# Patient Record
Sex: Female | Born: 1960 | Race: Black or African American | Hispanic: No | Marital: Married | Smoking: Never smoker
Health system: Southern US, Community
[De-identification: ages and names within clinical notes are randomized; demographics above are authoritative.]

## PROBLEM LIST (undated history)

## (undated) DIAGNOSIS — Z789 Other specified health status: Secondary | ICD-10-CM

## (undated) DIAGNOSIS — B009 Herpesviral infection, unspecified: Secondary | ICD-10-CM

## (undated) DIAGNOSIS — Z87442 Personal history of urinary calculi: Secondary | ICD-10-CM

## (undated) DIAGNOSIS — R011 Cardiac murmur, unspecified: Secondary | ICD-10-CM

## (undated) DIAGNOSIS — G709 Myoneural disorder, unspecified: Secondary | ICD-10-CM

---

## 1998-05-02 ENCOUNTER — Emergency Department (HOSPITAL_COMMUNITY): Admission: EM | Admit: 1998-05-02 | Discharge: 1998-05-02 | Payer: Self-pay

## 1999-01-10 ENCOUNTER — Emergency Department (HOSPITAL_COMMUNITY): Admission: EM | Admit: 1999-01-10 | Discharge: 1999-01-11 | Payer: Self-pay | Admitting: Emergency Medicine

## 1999-01-10 ENCOUNTER — Encounter: Payer: Self-pay | Admitting: Emergency Medicine

## 1999-01-16 ENCOUNTER — Encounter: Payer: Self-pay | Admitting: Internal Medicine

## 1999-01-16 ENCOUNTER — Ambulatory Visit (HOSPITAL_COMMUNITY): Admission: RE | Admit: 1999-01-16 | Discharge: 1999-01-16 | Payer: Self-pay | Admitting: Internal Medicine

## 1999-06-29 ENCOUNTER — Ambulatory Visit (HOSPITAL_COMMUNITY): Admission: RE | Admit: 1999-06-29 | Discharge: 1999-06-29 | Payer: Self-pay | Admitting: Urology

## 1999-06-29 ENCOUNTER — Encounter: Payer: Self-pay | Admitting: Urology

## 1999-07-06 ENCOUNTER — Other Ambulatory Visit: Admission: RE | Admit: 1999-07-06 | Discharge: 1999-07-06 | Payer: Self-pay | Admitting: Obstetrics and Gynecology

## 2000-01-19 ENCOUNTER — Other Ambulatory Visit: Admission: RE | Admit: 2000-01-19 | Discharge: 2000-01-19 | Payer: Self-pay | Admitting: Obstetrics and Gynecology

## 2000-04-18 ENCOUNTER — Other Ambulatory Visit: Admission: RE | Admit: 2000-04-18 | Discharge: 2000-04-18 | Payer: Self-pay | Admitting: Obstetrics and Gynecology

## 2000-07-08 ENCOUNTER — Other Ambulatory Visit: Admission: RE | Admit: 2000-07-08 | Discharge: 2000-07-08 | Payer: Self-pay | Admitting: Obstetrics and Gynecology

## 2000-07-08 ENCOUNTER — Encounter (INDEPENDENT_AMBULATORY_CARE_PROVIDER_SITE_OTHER): Payer: Self-pay

## 2000-10-20 ENCOUNTER — Other Ambulatory Visit: Admission: RE | Admit: 2000-10-20 | Discharge: 2000-10-20 | Payer: Self-pay | Admitting: Obstetrics and Gynecology

## 2001-04-10 ENCOUNTER — Other Ambulatory Visit: Admission: RE | Admit: 2001-04-10 | Discharge: 2001-04-10 | Payer: Self-pay | Admitting: Obstetrics and Gynecology

## 2001-04-11 ENCOUNTER — Encounter (INDEPENDENT_AMBULATORY_CARE_PROVIDER_SITE_OTHER): Payer: Self-pay | Admitting: Specialist

## 2001-04-11 ENCOUNTER — Other Ambulatory Visit: Admission: RE | Admit: 2001-04-11 | Discharge: 2001-04-11 | Payer: Self-pay | Admitting: Unknown Physician Specialty

## 2001-04-19 ENCOUNTER — Encounter: Admission: RE | Admit: 2001-04-19 | Discharge: 2001-04-19 | Payer: Self-pay | Admitting: Obstetrics and Gynecology

## 2001-04-19 ENCOUNTER — Encounter: Payer: Self-pay | Admitting: Obstetrics and Gynecology

## 2002-07-16 ENCOUNTER — Other Ambulatory Visit: Admission: RE | Admit: 2002-07-16 | Discharge: 2002-07-16 | Payer: Self-pay | Admitting: Obstetrics and Gynecology

## 2002-07-25 ENCOUNTER — Encounter: Payer: Self-pay | Admitting: Obstetrics and Gynecology

## 2002-07-25 ENCOUNTER — Ambulatory Visit (HOSPITAL_COMMUNITY): Admission: RE | Admit: 2002-07-25 | Discharge: 2002-07-25 | Payer: Self-pay | Admitting: Obstetrics and Gynecology

## 2002-10-19 ENCOUNTER — Encounter: Payer: Self-pay | Admitting: Obstetrics and Gynecology

## 2002-10-19 ENCOUNTER — Encounter: Admission: RE | Admit: 2002-10-19 | Discharge: 2002-10-19 | Payer: Self-pay | Admitting: Obstetrics and Gynecology

## 2003-08-13 ENCOUNTER — Other Ambulatory Visit: Admission: RE | Admit: 2003-08-13 | Discharge: 2003-08-13 | Payer: Self-pay | Admitting: Obstetrics and Gynecology

## 2003-10-05 HISTORY — PX: UTERINE FIBROID SURGERY: SHX826

## 2004-07-28 ENCOUNTER — Encounter: Admission: RE | Admit: 2004-07-28 | Discharge: 2004-07-28 | Payer: Self-pay | Admitting: Internal Medicine

## 2004-08-26 ENCOUNTER — Other Ambulatory Visit: Admission: RE | Admit: 2004-08-26 | Discharge: 2004-08-26 | Payer: Self-pay | Admitting: Obstetrics and Gynecology

## 2005-03-11 ENCOUNTER — Ambulatory Visit (HOSPITAL_COMMUNITY): Admission: RE | Admit: 2005-03-11 | Discharge: 2005-03-11 | Payer: Self-pay | Admitting: Obstetrics and Gynecology

## 2005-10-04 HISTORY — PX: LIPOMA EXCISION: SHX5283

## 2005-10-11 ENCOUNTER — Encounter: Admission: RE | Admit: 2005-10-11 | Discharge: 2005-10-11 | Payer: Self-pay | Admitting: Obstetrics and Gynecology

## 2005-11-02 ENCOUNTER — Other Ambulatory Visit: Admission: RE | Admit: 2005-11-02 | Discharge: 2005-11-02 | Payer: Self-pay | Admitting: Obstetrics and Gynecology

## 2006-03-04 ENCOUNTER — Encounter (INDEPENDENT_AMBULATORY_CARE_PROVIDER_SITE_OTHER): Payer: Self-pay | Admitting: Specialist

## 2006-03-04 ENCOUNTER — Ambulatory Visit (HOSPITAL_BASED_OUTPATIENT_CLINIC_OR_DEPARTMENT_OTHER): Admission: RE | Admit: 2006-03-04 | Discharge: 2006-03-04 | Payer: Self-pay | Admitting: *Deleted

## 2008-01-02 ENCOUNTER — Encounter: Admission: RE | Admit: 2008-01-02 | Discharge: 2008-01-02 | Payer: Self-pay | Admitting: Internal Medicine

## 2008-01-16 ENCOUNTER — Other Ambulatory Visit: Admission: RE | Admit: 2008-01-16 | Discharge: 2008-01-16 | Payer: Self-pay | Admitting: Obstetrics and Gynecology

## 2009-02-04 ENCOUNTER — Encounter: Admission: RE | Admit: 2009-02-04 | Discharge: 2009-02-04 | Payer: Self-pay | Admitting: Obstetrics and Gynecology

## 2009-02-24 ENCOUNTER — Other Ambulatory Visit: Admission: RE | Admit: 2009-02-24 | Discharge: 2009-02-24 | Payer: Self-pay | Admitting: Obstetrics and Gynecology

## 2010-03-16 ENCOUNTER — Encounter: Admission: RE | Admit: 2010-03-16 | Discharge: 2010-03-16 | Payer: Self-pay | Admitting: Internal Medicine

## 2010-10-25 ENCOUNTER — Encounter: Payer: Self-pay | Admitting: Internal Medicine

## 2011-02-19 NOTE — Op Note (Signed)
Alicia [name], Alicia [name]                ACCOUNT NO.:  [id number]   MEDICAL RECORD NO.:  [id number]          PATIENT TYPE:  AMB   LOCATION:  [facility]                         FACILITY:  [facility]   PHYSICIAN:  Alfonse Ras, MD   DATE OF BIRTH:  1961/03/14   DATE OF PROCEDURE:  03/04/2006  DATE OF DISCHARGE:                                 OPERATIVE REPORT   PREOPERATIVE DIAGNOSIS:  Left shoulder lipoma.   POSTOPERATIVE DIAGNOSIS:  Left shoulder lipoma.   PROCEDURE:  Excision of left shoulder lipoma.   SURGEON:  Alfonse Ras, M.D.   ANESTHESIA:  Local MAC.   DESCRIPTION:  The patient was taken to the operating room and placed in a  supine position.  After adequate MAC anesthesia was induced, the left  shoulder was prepped and draped in a normal sterile fashion. 1% lidocaine  local anesthesia was used to anesthetize the skin and subcutaneous tissues  surrounding the mass on the left anterior shoulder.  I dissected down  through the skin with a knife and then created flaps around the well  encapsulated lipoma which easily was excised anterior to the deltoid muscle.  This was sent for pathologic evaluation.  A photo was taken of it per the  request of the patient.  The skin was closed with subcuticular 4-0 Monocryl.  Dermabond dressing was placed.  The patient tolerated the procedure well and  went to the PACU in good condition.      Alfonse Ras, MD  Electronically Signed     KRE/MEDQ  D:  03/04/2006  T:  03/04/2006  Job:  [id number]

## 2011-03-12 ENCOUNTER — Other Ambulatory Visit: Payer: Self-pay | Admitting: Internal Medicine

## 2011-03-12 DIAGNOSIS — Z1231 Encounter for screening mammogram for malignant neoplasm of breast: Secondary | ICD-10-CM

## 2011-03-19 ENCOUNTER — Ambulatory Visit
Admission: RE | Admit: 2011-03-19 | Discharge: 2011-03-19 | Disposition: A | Discharge status: Home / Self Care | Payer: 59 | Source: Ambulatory Visit | Attending: Internal Medicine | Admitting: Internal Medicine

## 2011-03-19 DIAGNOSIS — Z1231 Encounter for screening mammogram for malignant neoplasm of breast: Secondary | ICD-10-CM

## 2011-12-20 ENCOUNTER — Other Ambulatory Visit: Payer: Self-pay | Admitting: Obstetrics and Gynecology

## 2011-12-20 ENCOUNTER — Other Ambulatory Visit (HOSPITAL_COMMUNITY)
Admission: RE | Admit: 2011-12-20 | Discharge: 2011-12-20 | Disposition: A | Discharge status: Home / Self Care | Payer: 59 | Source: Ambulatory Visit | Attending: Obstetrics and Gynecology | Admitting: Obstetrics and Gynecology

## 2011-12-20 DIAGNOSIS — Z1159 Encounter for screening for other viral diseases: Secondary | ICD-10-CM | POA: Insufficient documentation

## 2011-12-20 DIAGNOSIS — Z01419 Encounter for gynecological examination (general) (routine) without abnormal findings: Secondary | ICD-10-CM | POA: Insufficient documentation

## 2012-03-01 ENCOUNTER — Other Ambulatory Visit: Payer: Self-pay | Admitting: Internal Medicine

## 2012-03-01 DIAGNOSIS — Z1231 Encounter for screening mammogram for malignant neoplasm of breast: Secondary | ICD-10-CM

## 2012-03-20 ENCOUNTER — Ambulatory Visit
Admission: RE | Admit: 2012-03-20 | Discharge: 2012-03-20 | Disposition: A | Discharge status: Home / Self Care | Payer: 59 | Source: Ambulatory Visit | Attending: Internal Medicine | Admitting: Internal Medicine

## 2012-03-20 DIAGNOSIS — Z1231 Encounter for screening mammogram for malignant neoplasm of breast: Secondary | ICD-10-CM

## 2012-11-14 ENCOUNTER — Other Ambulatory Visit: Payer: Self-pay | Admitting: Internal Medicine

## 2012-11-14 DIAGNOSIS — N6325 Unspecified lump in the left breast, overlapping quadrants: Secondary | ICD-10-CM

## 2012-11-14 DIAGNOSIS — Z1231 Encounter for screening mammogram for malignant neoplasm of breast: Secondary | ICD-10-CM

## 2013-01-02 ENCOUNTER — Other Ambulatory Visit: Payer: Self-pay | Admitting: Obstetrics and Gynecology

## 2013-01-02 ENCOUNTER — Other Ambulatory Visit (HOSPITAL_COMMUNITY)
Admission: RE | Admit: 2013-01-02 | Discharge: 2013-01-02 | Disposition: A | Discharge status: Home / Self Care | Payer: 59 | Source: Ambulatory Visit | Attending: Obstetrics and Gynecology | Admitting: Obstetrics and Gynecology

## 2013-01-02 DIAGNOSIS — Z01419 Encounter for gynecological examination (general) (routine) without abnormal findings: Secondary | ICD-10-CM | POA: Insufficient documentation

## 2013-01-02 DIAGNOSIS — Z1151 Encounter for screening for human papillomavirus (HPV): Secondary | ICD-10-CM | POA: Insufficient documentation

## 2013-01-03 ENCOUNTER — Other Ambulatory Visit: Payer: Self-pay | Admitting: Internal Medicine

## 2013-01-03 DIAGNOSIS — N6325 Unspecified lump in the left breast, overlapping quadrants: Secondary | ICD-10-CM

## 2013-01-03 DIAGNOSIS — Z1231 Encounter for screening mammogram for malignant neoplasm of breast: Secondary | ICD-10-CM

## 2013-01-15 ENCOUNTER — Ambulatory Visit
Admission: RE | Admit: 2013-01-15 | Discharge: 2013-01-15 | Disposition: A | Discharge status: Home / Self Care | Payer: 59 | Source: Ambulatory Visit | Attending: Internal Medicine | Admitting: Internal Medicine

## 2013-01-15 DIAGNOSIS — N6325 Unspecified lump in the left breast, overlapping quadrants: Secondary | ICD-10-CM

## 2013-01-22 ENCOUNTER — Encounter (HOSPITAL_BASED_OUTPATIENT_CLINIC_OR_DEPARTMENT_OTHER): Payer: Self-pay | Admitting: *Deleted

## 2013-01-22 NOTE — Progress Notes (Signed)
No heart or resp problems 

## 2013-01-24 NOTE — H&P (Signed)
[facility] [address]   [address] [place], [place] [address] [phone] A Division of [facility]  Loreta Ave, M.D.   Robert A. Thurston Hole, M.D.   Burnell Blanks, M.D.   Eulas Post, M.D.   Lunette Stands, M.D Buford Dresser, M.D.  Charlsie Quest, M.D.   Estell Harpin, M.D.   Melina Fiddler, M.D. Genene Churn. Barry Dienes, PA-C            Kirstin A. Shepperson, PA-C Josh [place]field, PA-C [place], [place]   RE: Alicia, [name]                                [phone]      DOB: [birth year] PROGRESS NOTE: 12-01-12 Alicia [name] is a 52 year-old, new patient to the office.  Presents for a second opinion and treatment recommendation for persistent symptoms, left lower leg.  She has brought with her numerous evaluation and treatment notes from [facility], which I have reviewed.  These are notes from Dr. Lestine Box and Dr. Ethelene Hal.  Presents with insidious onset of pain and numbness in the medial aspect of the left ankle with numbness going down into the plantar aspect of her foot.  Persisting and getting worse.  Evaluation by Dr. Lestine Box reveals longstanding rigid pes planus.  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  No evidence of peripheral neuropathy or radiculopathy.  MRI of the lumbar spine showing some changes at L3-S1, but without significant nerve compression or encroachment.  Plain x-rays from [facility] revealing the pes planus not too extreme.  No significant degenerative changes.  No acute fractures.  In light of all of these studies it has been recommended that definitive treatment is tarsal tunnel release, which she has been hoping to avoid, but her symptoms are now getting the best of her.  Starting to get some symptoms of her leg behind her knee where she feels some tightness and fullness.  Really no mechanical symptoms in her knee.  Some occasional back pain, but nothing more  proximal radicular.  Works as a Research scientist (medical).   Remaining history is reviewed, updated and included in the chart.  No history of diabetes or peripheral neuropathy.    EXAMINATION: General exam is outlined and included in the chart.  Specifically, mild decreased lumbar motion.  A little bit of pain with straight leg raise, left greater than right, not extreme.  Negative log roll of both hips.  Both knees have full motion.  Some tightness and fullness behind the left knee, although not extreme.  Really not getting meniscal signs.  Extensor mechanism is intact.  Reasonable tracking without crepitus.  Ligaments stable.  Distally she has an exquisite Tinel's over the tarsal tunnel on the left.  Decreased subjective sensation bottom of her foot, but no atrophy.  Pes planus both sides, which is relatively fixed with no arch even when sitting.  Good motion and stability of all joints.    X-RAYS: Four view standing x-ray obtained of both knees that shows some changes inferior patella with some bone overgrowth on the left.  Nothing acute.  No degenerative changes.  No fractures.    IMPRESSION: Tarsal tunnel syndrome, left.  Diagnosis is well confirmed.   PLAN: More than 60 minutes spent reviewing all of her previous treatment notes, previous x-rays, previous studies and discussing all of these  with her, as well as my current findings.  Definitive treatment is tarsal tunnel release.  Really she has no other options, other than to live with her symptoms.  Procedure, risks, benefits and complications reviewed in detail.  Anticipated recovery and rehab outlined.  We have talked about wound healing in that area.  We have also talked about the longer she goes the more compression she puts on the nerve, the less likelihood of complete recovery and also increasing time of recovery of the nerve given more compression.  She has to consider her options and let me know how she wants to proceed.  Paperwork complete.  All  questions answered.  I will wait to hear from her.  Loreta Ave, M.D. Electronically verified by Loreta Ave, M.D. DFM:jjh Cc: Dr. Leonides Grills, fax: [phone] D 12-05-12, T 12-06-12

## 2013-01-25 ENCOUNTER — Encounter (HOSPITAL_BASED_OUTPATIENT_CLINIC_OR_DEPARTMENT_OTHER)
Admission: RE | Disposition: A | Discharge status: Home / Self Care | Payer: Self-pay | Source: Ambulatory Visit | Attending: Orthopedic Surgery

## 2013-01-25 ENCOUNTER — Ambulatory Visit (HOSPITAL_BASED_OUTPATIENT_CLINIC_OR_DEPARTMENT_OTHER)
Admission: RE | Admit: 2013-01-25 | Discharge: 2013-01-25 | Disposition: A | Discharge status: Home / Self Care | Payer: 59 | Source: Ambulatory Visit | Attending: Orthopedic Surgery | Admitting: Orthopedic Surgery

## 2013-01-25 ENCOUNTER — Encounter (HOSPITAL_BASED_OUTPATIENT_CLINIC_OR_DEPARTMENT_OTHER): Payer: Self-pay | Admitting: *Deleted

## 2013-01-25 ENCOUNTER — Encounter (HOSPITAL_BASED_OUTPATIENT_CLINIC_OR_DEPARTMENT_OTHER): Payer: Self-pay | Admitting: Certified Registered"

## 2013-01-25 ENCOUNTER — Ambulatory Visit (HOSPITAL_BASED_OUTPATIENT_CLINIC_OR_DEPARTMENT_OTHER): Payer: 59 | Admitting: Certified Registered"

## 2013-01-25 DIAGNOSIS — G5750 Tarsal tunnel syndrome, unspecified lower limb: Secondary | ICD-10-CM | POA: Insufficient documentation

## 2013-01-25 DIAGNOSIS — G5752 Tarsal tunnel syndrome, left lower limb: Secondary | ICD-10-CM

## 2013-01-25 HISTORY — PX: TARSAL TUNNEL RELEASE: SHX5042

## 2013-01-25 HISTORY — DX: Herpesviral infection, unspecified: B00.9

## 2013-01-25 HISTORY — DX: Other specified health status: Z78.9

## 2013-01-25 SURGERY — RELEASE, TARSAL TUNNEL
Anesthesia: General | Site: Foot | Laterality: Left | Wound class: Clean

## 2013-01-25 MED ORDER — HYDROMORPHONE HCL PF 1 MG/ML IJ SOLN
0.2500 mg | INTRAMUSCULAR | Status: DC | PRN
  Administered 2013-01-25: 0.5 mg via INTRAVENOUS

## 2013-01-25 MED ORDER — OXYCODONE-ACETAMINOPHEN 5-325 MG PO TABS: 1.0000 | ORAL_TABLET | ORAL | Status: DC | PRN

## 2013-01-25 MED ORDER — OXYCODONE HCL 5 MG/5ML PO SOLN: 5.0000 mg | Freq: Once | ORAL | Status: DC | PRN

## 2013-01-25 MED ORDER — MIDAZOLAM HCL 5 MG/5ML IJ SOLN
INTRAMUSCULAR | Status: DC | PRN
  Administered 2013-01-25: 2 mg via INTRAVENOUS

## 2013-01-25 MED ORDER — ONDANSETRON HCL 4 MG/2ML IJ SOLN
INTRAMUSCULAR | Status: DC | PRN
  Administered 2013-01-25: 4 mg via INTRAVENOUS

## 2013-01-25 MED ORDER — FENTANYL CITRATE 0.05 MG/ML IJ SOLN
INTRAMUSCULAR | Status: DC | PRN
  Administered 2013-01-25 (×2): 50 ug via INTRAVENOUS

## 2013-01-25 MED ORDER — LACTATED RINGERS IV SOLN
INTRAVENOUS | Status: DC
  Administered 2013-01-25 (×2): via INTRAVENOUS

## 2013-01-25 MED ORDER — PROPOFOL 10 MG/ML IV BOLUS
INTRAVENOUS | Status: DC | PRN
  Administered 2013-01-25: 300 mg via INTRAVENOUS

## 2013-01-25 MED ORDER — BUPIVACAINE HCL (PF) 0.25 % IJ SOLN
INTRAMUSCULAR | Status: DC | PRN
  Administered 2013-01-25: 10 mL

## 2013-01-25 MED ORDER — ONDANSETRON HCL 4 MG/2ML IJ SOLN: 4.0000 mg | Freq: Once | INTRAMUSCULAR | Status: DC | PRN

## 2013-01-25 MED ORDER — CEFAZOLIN SODIUM-DEXTROSE 2-3 GM-% IV SOLR
2.0000 g | INTRAVENOUS | Status: AC
Start: 2013-01-25 — End: 2013-01-25
  Administered 2013-01-25: 2 g via INTRAVENOUS

## 2013-01-25 MED ORDER — DEXAMETHASONE SODIUM PHOSPHATE 4 MG/ML IJ SOLN
INTRAMUSCULAR | Status: DC | PRN
  Administered 2013-01-25: 8 mg via INTRAVENOUS

## 2013-01-25 MED ORDER — LIDOCAINE HCL (CARDIAC) 20 MG/ML IV SOLN
INTRAVENOUS | Status: DC | PRN
  Administered 2013-01-25: 100 mg via INTRAVENOUS

## 2013-01-25 MED ORDER — OXYCODONE HCL 5 MG PO TABS: 5.0000 mg | ORAL_TABLET | Freq: Once | ORAL | Status: DC | PRN

## 2013-01-25 SURGICAL SUPPLY — 58 items
BANDAGE ELASTIC 4 VELCRO ST LF (GAUZE/BANDAGES/DRESSINGS) ×2 IMPLANT
BANDAGE ELASTIC 6 VELCRO ST LF (GAUZE/BANDAGES/DRESSINGS) ×2 IMPLANT
BANDAGE ESMARK 6X9 LF (GAUZE/BANDAGES/DRESSINGS) ×1 IMPLANT
BENZOIN TINCTURE PRP APPL 2/3 (GAUZE/BANDAGES/DRESSINGS) IMPLANT
BLADE SURG 15 STRL LF DISP TIS (BLADE) ×1 IMPLANT
BLADE SURG 15 STRL SS (BLADE) ×1
BNDG COHESIVE 4X5 TAN STRL (GAUZE/BANDAGES/DRESSINGS) ×2 IMPLANT
BNDG ESMARK 6X9 LF (GAUZE/BANDAGES/DRESSINGS) ×2
CANISTER SUCTION 1200CC (MISCELLANEOUS) IMPLANT
CLOTH BEACON ORANGE TIMEOUT ST (SAFETY) ×2 IMPLANT
COVER TABLE BACK 60X90 (DRAPES) ×2 IMPLANT
CUFF TOURNIQUET SINGLE 18IN (TOURNIQUET CUFF) IMPLANT
CUFF TOURNIQUET SINGLE 34IN LL (TOURNIQUET CUFF) ×2 IMPLANT
DECANTER SPIKE VIAL GLASS SM (MISCELLANEOUS) IMPLANT
DRAPE EXTREMITY T 121X128X90 (DRAPE) ×2 IMPLANT
DRAPE U 20/CS (DRAPES) ×2 IMPLANT
DRAPE U-SHAPE 47X51 STRL (DRAPES) ×2 IMPLANT
DRSG PAD ABDOMINAL 8X10 ST (GAUZE/BANDAGES/DRESSINGS) ×2 IMPLANT
DURAPREP 26ML APPLICATOR (WOUND CARE) ×2 IMPLANT
ELECT NEEDLE TIP 2.8 STRL (NEEDLE) ×2 IMPLANT
ELECT REM PT RETURN 9FT ADLT (ELECTROSURGICAL) ×2
ELECTRODE REM PT RTRN 9FT ADLT (ELECTROSURGICAL) ×1 IMPLANT
GAUZE XEROFORM 1X8 LF (GAUZE/BANDAGES/DRESSINGS) ×2 IMPLANT
GLOVE BIOGEL PI IND STRL 8 (GLOVE) ×1 IMPLANT
GLOVE BIOGEL PI INDICATOR 8 (GLOVE) ×1
GLOVE ORTHO TXT STRL SZ7.5 (GLOVE) ×4 IMPLANT
GLOVE SKINSENSE NS SZ7.0 (GLOVE) ×1
GLOVE SKINSENSE STRL SZ7.0 (GLOVE) ×1 IMPLANT
GOWN BRE IMP PREV XXLGXLNG (GOWN DISPOSABLE) ×2 IMPLANT
GOWN PREVENTION PLUS XLARGE (GOWN DISPOSABLE) ×4 IMPLANT
NEEDLE HYPO 25X1 1.5 SAFETY (NEEDLE) ×2 IMPLANT
NS IRRIG 1000ML POUR BTL (IV SOLUTION) ×2 IMPLANT
PACK BASIN DAY SURGERY FS (CUSTOM PROCEDURE TRAY) ×2 IMPLANT
PAD CAST 3X4 CTTN HI CHSV (CAST SUPPLIES) ×1 IMPLANT
PAD CAST 4YDX4 CTTN HI CHSV (CAST SUPPLIES) ×1 IMPLANT
PADDING CAST COTTON 3X4 STRL (CAST SUPPLIES) ×1
PADDING CAST COTTON 4X4 STRL (CAST SUPPLIES) ×1
PADDING CAST COTTON 6X4 STRL (CAST SUPPLIES) ×2 IMPLANT
PENCIL BUTTON HOLSTER BLD 10FT (ELECTRODE) ×2 IMPLANT
SPONGE GAUZE 4X4 12PLY (GAUZE/BANDAGES/DRESSINGS) ×2 IMPLANT
SPONGE LAP 4X18 X RAY DECT (DISPOSABLE) ×2 IMPLANT
STOCKINETTE 4X48 STRL (DRAPES) ×2 IMPLANT
SUCTION FRAZIER TIP 10 FR DISP (SUCTIONS) IMPLANT
SUT ETHIBOND 2 OS 4 DA (SUTURE) IMPLANT
SUT ETHILON 2 0 FS 18 (SUTURE) ×2 IMPLANT
SUT ETHILON 2 0 FSLX (SUTURE) IMPLANT
SUT ETHILON 3 0 PS 1 (SUTURE) ×4 IMPLANT
SUT VIC AB 0 SH 27 (SUTURE) IMPLANT
SUT VIC AB 2-0 SH 27 (SUTURE) ×1
SUT VIC AB 2-0 SH 27XBRD (SUTURE) ×1 IMPLANT
SUT VIC AB 3-0 SH 27 (SUTURE)
SUT VIC AB 3-0 SH 27X BRD (SUTURE) IMPLANT
SUT VICRYL 4-0 PS2 18IN ABS (SUTURE) IMPLANT
SYR BULB 3OZ (MISCELLANEOUS) ×2 IMPLANT
SYR CONTROL 10ML LL (SYRINGE) ×2 IMPLANT
TUBE CONNECTING 20X1/4 (TUBING) IMPLANT
UNDERPAD 30X30 INCONTINENT (UNDERPADS AND DIAPERS) ×2 IMPLANT
YANKAUER SUCT BULB TIP NO VENT (SUCTIONS) ×2 IMPLANT

## 2013-01-25 NOTE — Anesthesia Preprocedure Evaluation (Signed)

## 2013-01-25 NOTE — Op Note (Deleted)
NAMEMarland Kitchen  SHONI, QUIJAS             ACCOUNT NO.:  [id number]  MEDICAL RECORD NO.:  [id number]  LOCATION:  CYT                          FACILITY:  [facility]  PHYSICIAN:  Loreta Ave, M.D. DATE OF BIRTH:  Feb 24, 1961  DATE OF PROCEDURE:  01/25/2013 DATE OF DISCHARGE:  01/25/2013                              OPERATIVE REPORT   PREOPERATIVE DIAGNOSIS:  Left ankle tarsal tunnel syndrome.  POSTOPERATIVE DIAGNOSIS:  Left ankle tarsal tunnel syndrome.  PROCEDURE:  Tarsal tunnel release, left.  SURGEON:  Loreta Ave, M.D.  ASSISTANT:  Genene Churn. Barry Dienes, PA  ANESTHESIA:  General.  ESTIMATED BLOOD LOSS:  Minimal.  SPECIMENS:  None.  CULTURES:  None.  COMPLICATION:  None.  DRESSINGS:  Soft compressive short leg splint, well-padded.  TOURNIQUET TIME:  45 minutes.  PROCEDURE IN DETAIL:  The patient was brought to the operating room, placed on the operating table in supine position.  After adequate anesthesia had been obtained, tourniquet applied.  Prepped and draped in the usual sterile fashion.  Exsanguinated with elevation of Esmarch, tourniquet inflated to 350 mmHg.  Incision over the tarsal tunnel from proximal to distal on the left.  Skin and subcutaneous tissue divided. Hemostasis with cautery.  Retinaculum centered over the neurovascular structures was then divided well proximally and distally.  Completely unroofing the tarsal tunnel.  The nerve identified proximally followed all the way down distally to the bifurcation, where I did a small muscle split in the abductor to facilitate that.  Calcaneal branch digital medial and plantar branches identified and protected throughout.  Nice complete decompression throughout.  Neurovascular structures, artery, and nerve identified and protected throughout.  Once I had a nice complete decompression, wound was irrigated.  Closed with Vicryl and nylon.  Margins were injected with Marcaine.  Sterile compressive dressing applied.   Short-leg splint applied.  Tourniquet was deflated and removed.  Anesthesia was reversed.  Brought to the recovery room.  Tolerated the surgery well.  No complications.     Loreta Ave, M.D.     DFM/MEDQ  D:  01/25/2013  T:  01/25/2013  Job:  [id number]

## 2013-01-25 NOTE — Interval H&P Note (Signed)
History and Physical Interval Note:  01/25/2013 7:29 AM  Alicia [name]  has presented today for surgery, with the diagnosis of LEFT FOOT MONONEURITIS-TARSAL TUNNEL SYNDROME  The various methods of treatment have been discussed with the patient and family. After consideration of risks, benefits and other options for treatment, the patient has consented to  Procedure(s): LEFT FOOT RELEASE TARSAL TUNNEL (POST TIBIAL NERVE DECOMPRESS) (Left) as a surgical intervention .  The patient's history has been reviewed, patient examined, no change in status, stable for surgery.  I have reviewed the patient's chart and labs.  Questions were answered to the patient's satisfaction.     Charnele Semple F

## 2013-01-25 NOTE — Op Note (Signed)
NAME:  Ransome, Nakeeta L             ACCOUNT NO.:  [id number]  MEDICAL RECORD NO.:  [id number]  LOCATION:  CYT                          FACILITY:  [facility]  PHYSICIAN:  Daniel F. Murphy, M.D. DATE OF BIRTH:  01/11/1961  DATE OF PROCEDURE:  01/25/2013 DATE OF DISCHARGE:  01/25/2013                              OPERATIVE REPORT   PREOPERATIVE DIAGNOSIS:  Left ankle tarsal tunnel syndrome.  POSTOPERATIVE DIAGNOSIS:  Left ankle tarsal tunnel syndrome.  PROCEDURE:  Tarsal tunnel release, left.  SURGEON:  Daniel F. Murphy, M.D.  ASSISTANT:  James M. Owens, PA  ANESTHESIA:  General.  ESTIMATED BLOOD LOSS:  Minimal.  SPECIMENS:  None.  CULTURES:  None.  COMPLICATION:  None.  DRESSINGS:  Soft compressive short leg splint, well-padded.  TOURNIQUET TIME:  45 minutes.  PROCEDURE IN DETAIL:  The patient was brought to the operating room, placed on the operating table in supine position.  After adequate anesthesia had been obtained, tourniquet applied.  Prepped and draped in the usual sterile fashion.  Exsanguinated with elevation of Esmarch, tourniquet inflated to 350 mmHg.  Incision over the tarsal tunnel from proximal to distal on the left.  Skin and subcutaneous tissue divided. Hemostasis with cautery.  Retinaculum centered over the neurovascular structures was then divided well proximally and distally.  Completely unroofing the tarsal tunnel.  The nerve identified proximally followed all the way down distally to the bifurcation, where I did a small muscle split in the abductor to facilitate that.  Calcaneal branch digital medial and plantar branches identified and protected throughout.  Nice complete decompression throughout.  Neurovascular structures, artery, and nerve identified and protected throughout.  Once I had a nice complete decompression, wound was irrigated.  Closed with Vicryl and nylon.  Margins were injected with Marcaine.  Sterile compressive dressing applied.   Short-leg splint applied.  Tourniquet was deflated and removed.  Anesthesia was reversed.  Brought to the recovery room.  Tolerated the surgery well.  No complications.     Daniel F. Murphy, M.D.     DFM/MEDQ  D:  01/25/2013  T:  01/25/2013  Job:  [id number] 

## 2013-01-25 NOTE — Anesthesia Postprocedure Evaluation (Signed)
  Anesthesia Post-op Note  Patient: Alicia [name]  Procedure(s) Performed: Procedure(s): LEFT FOOT RELEASE TARSAL TUNNEL (POST TIBIAL NERVE DECOMPRESS) (Left)  Patient Location: PACU  Anesthesia Type:General  Level of Consciousness: awake, alert  and oriented  Airway and Oxygen Therapy: Patient Spontanous Breathing and Patient connected to face mask oxygen  Post-op Pain: mild  Post-op Assessment: Post-op Vital signs reviewed  Post-op Vital Signs: Reviewed  Complications: No apparent anesthesia complications

## 2013-01-25 NOTE — Anesthesia Procedure Notes (Signed)
Procedure Name: LMA Insertion Date/Time: 01/25/2013 10:01 AM Performed by: Zenia Resides D Pre-anesthesia Checklist: Patient identified, Emergency Drugs available, Suction available and Patient being monitored Patient Re-evaluated:Patient Re-evaluated prior to inductionOxygen Delivery Method: Circle System Utilized Preoxygenation: Pre-oxygenation with 100% oxygen Intubation Type: IV induction Ventilation: Mask ventilation without difficulty LMA: LMA inserted LMA Size: 4.0 Number of attempts: 1 Airway Equipment and Method: bite block Placement Confirmation: positive ETCO2 and breath sounds checked- equal and bilateral Tube secured with: Tape Dental Injury: Teeth and Oropharynx as per pre-operative assessment

## 2013-01-25 NOTE — Transfer of Care (Signed)
Immediate Anesthesia Transfer of Care Note  Patient: Alicia [name]  Procedure(s) Performed: Procedure(s): LEFT FOOT RELEASE TARSAL TUNNEL (POST TIBIAL NERVE DECOMPRESS) (Left)  Patient Location: PACU  Anesthesia Type:General  Level of Consciousness: awake, alert  and oriented  Airway & Oxygen Therapy: Patient Spontanous Breathing and Patient connected to face mask oxygen  Post-op Assessment: Report given to PACU RN and Post -op Vital signs reviewed and stable  Post vital signs: Reviewed and stable  Complications: No apparent anesthesia complications

## 2013-01-25 NOTE — Brief Op Note (Signed)
01/25/2013  1:35 PM  PATIENT:  Alicia [name]  53 y.o. female  PRE-OPERATIVE DIAGNOSIS:  LEFT FOOT MONONEURITIS-TARSAL TUNNEL SYNDROME  POST-OPERATIVE DIAGNOSIS:  LEFT FOOT MONONEURITIS-TARSAL TUNNEL SYNDROME  PROCEDURE:  Procedure(s): LEFT FOOT RELEASE TARSAL TUNNEL (POST TIBIAL NERVE DECOMPRESS) (Left)  SURGEON:  Surgeon(s) and Role:    * Loreta Ave, MD - Primary  PHYSICIAN ASSISTANT: Zonia Kief M     ANESTHESIA:   general  EBL:  Total I/O In: 1722 [P.O.:222; I.V.:1500] Out: -    SPECIMEN:  No Specimen  DISPOSITION OF SPECIMEN:  N/A  COUNTS:  YES  TOURNIQUET:   Total Tourniquet Time Documented: Thigh (Left) - 43 minutes Total: Thigh (Left) - 43 minutes    PATIENT DISPOSITION:  PACU - hemodynamically stable.

## 2013-01-26 ENCOUNTER — Encounter (HOSPITAL_BASED_OUTPATIENT_CLINIC_OR_DEPARTMENT_OTHER): Payer: Self-pay | Admitting: Orthopedic Surgery

## 2013-03-21 ENCOUNTER — Ambulatory Visit: Payer: 59

## 2013-03-22 ENCOUNTER — Other Ambulatory Visit: Payer: Self-pay | Admitting: Gastroenterology

## 2013-03-29 ENCOUNTER — Encounter (HOSPITAL_COMMUNITY): Payer: Self-pay | Admitting: Pharmacy Technician

## 2013-03-30 ENCOUNTER — Encounter (HOSPITAL_COMMUNITY): Payer: Self-pay | Admitting: *Deleted

## 2013-03-30 DIAGNOSIS — Z87442 Personal history of urinary calculi: Secondary | ICD-10-CM

## 2013-03-30 HISTORY — DX: Personal history of urinary calculi: Z87.442

## 2013-04-03 ENCOUNTER — Other Ambulatory Visit: Payer: Self-pay

## 2013-04-03 DIAGNOSIS — Z1231 Encounter for screening mammogram for malignant neoplasm of breast: Secondary | ICD-10-CM

## 2013-04-05 ENCOUNTER — Ambulatory Visit: Payer: 59

## 2013-04-17 ENCOUNTER — Ambulatory Visit (HOSPITAL_COMMUNITY)
Admission: RE | Admit: 2013-04-17 | Discharge: 2013-04-17 | Disposition: A | Discharge status: Home / Self Care | Payer: 59 | Source: Ambulatory Visit | Attending: Gastroenterology | Admitting: Gastroenterology

## 2013-04-17 ENCOUNTER — Encounter (HOSPITAL_COMMUNITY): Payer: Self-pay | Admitting: *Deleted

## 2013-04-17 ENCOUNTER — Ambulatory Visit (HOSPITAL_COMMUNITY): Payer: 59 | Admitting: Anesthesiology

## 2013-04-17 ENCOUNTER — Encounter (HOSPITAL_COMMUNITY)
Admission: RE | Disposition: A | Discharge status: Home / Self Care | Payer: Self-pay | Source: Ambulatory Visit | Attending: Gastroenterology

## 2013-04-17 ENCOUNTER — Encounter (HOSPITAL_COMMUNITY): Payer: Self-pay | Admitting: Anesthesiology

## 2013-04-17 DIAGNOSIS — E7800 Pure hypercholesterolemia, unspecified: Secondary | ICD-10-CM | POA: Insufficient documentation

## 2013-04-17 DIAGNOSIS — Z1211 Encounter for screening for malignant neoplasm of colon: Secondary | ICD-10-CM | POA: Insufficient documentation

## 2013-04-17 HISTORY — PX: COLONOSCOPY WITH PROPOFOL: SHX5780

## 2013-04-17 HISTORY — DX: Cardiac murmur, unspecified: R01.1

## 2013-04-17 HISTORY — DX: Myoneural disorder, unspecified: G70.9

## 2013-04-17 HISTORY — DX: Personal history of urinary calculi: Z87.442

## 2013-04-17 SURGERY — COLONOSCOPY WITH PROPOFOL
Anesthesia: General

## 2013-04-17 MED ORDER — LACTATED RINGERS IV SOLN
INTRAVENOUS | Status: DC
  Administered 2013-04-17: 1000 mL via INTRAVENOUS

## 2013-04-17 MED ORDER — MIDAZOLAM HCL 5 MG/5ML IJ SOLN
INTRAMUSCULAR | Status: DC | PRN
  Administered 2013-04-17: 2 mg via INTRAVENOUS

## 2013-04-17 MED ORDER — SODIUM CHLORIDE 0.9 % IV SOLN: INTRAVENOUS | Status: DC

## 2013-04-17 MED ORDER — PROPOFOL INFUSION 10 MG/ML OPTIME
INTRAVENOUS | Status: DC | PRN
  Administered 2013-04-17: 70 ug/kg/min via INTRAVENOUS

## 2013-04-17 MED ORDER — KETAMINE HCL 10 MG/ML IJ SOLN
INTRAMUSCULAR | Status: DC | PRN
  Administered 2013-04-17: 20 mg via INTRAVENOUS

## 2013-04-17 SURGICAL SUPPLY — 21 items

## 2013-04-17 NOTE — H&P (Signed)
  Procedure: Baseline screening colonoscopy  History: The patient is a 52 year old female born 05-03-1961. The patient is scheduled to undergo her first screening colonoscopy with polypectomy to prevent colon cancer.  Past medical and surgical history: Seasonal rhinitis. Hypercholesterolemia. Uterine fibroid treated by myomectomy. Genital herpes. Benign paroxysmal positional vertigo. Kidney stone. Vitamin D deficiency. Urethral stretching.  Habits: The patient has never smoked cigarettes and consumes alcohol in moderation  Exam: The patient is alert and lying comfortably on the endoscopy stretcher. Abdomen is soft, flat, and nontender to palpation. Lungs are clear to auscultation. Cardiac exam reveals a regular rhythm.  Plan: Proceed with baseline screening colonoscopy.

## 2013-04-17 NOTE — Op Note (Signed)
Procedure: Baseline screening colonoscopy.  Endoscopist: Vetrano Alyzah Pelly  Premedication: Propofol administered by anesthesia  Procedure: The patient was placed in the left lateral decubitus position. Anal inspection and digital rectal exam were normal. The Pentax pediatric colonoscope was introduced into the rectum and easily advanced to the cecum. A normal-appearing ileocecal valve and appendiceal orifice were identified. Colonic preparation for the exam today was good.  Rectum. Normal. Retroflex view of the distal rectum normal.  Sigmoid colon and descending colon. Normal.  Splenic flexure. Normal.  Transverse colon. Normal.  Hepatic flexure. Normal.  Ascending colon. Normal.  Cecum and ileocecal valve. Normal.  Assessment: Normal baseline screening proctocolonoscopy to the cecum.  Recommendations: Schedule repeat screening colonoscopy in 10 years. 

## 2013-04-17 NOTE — Anesthesia Postprocedure Evaluation (Signed)
Anesthesia Post Note  Patient: Alicia [name]  Procedure(s) Performed: Procedure(s) (LRB): COLONOSCOPY WITH PROPOFOL (N/A)  Anesthesia type: MAC  Patient location: PACU  Post pain: Pain level controlled  Post assessment: Post-op Vital signs reviewed  Last Vitals:  Filed Vitals:   04/17/13 0950  BP: 149/90  Temp:   Resp: 17    Post vital signs: Reviewed  Level of consciousness: sedated  Complications: No apparent anesthesia complications

## 2013-04-17 NOTE — Anesthesia Preprocedure Evaluation (Signed)
Anesthesia Evaluation  Patient identified by MRN, date of birth, ID band Patient awake    Reviewed: Allergy & Precautions, H&P , NPO status , Patient's Chart, lab work & pertinent test results  Airway Mallampati: II TM Distance: >3 FB     Dental  (+) Teeth Intact and Dental Advisory Given   Pulmonary neg pulmonary ROS,  breath sounds clear to auscultation  Pulmonary exam normal       Cardiovascular negative cardio ROS  + Valvular Problems/Murmurs Rhythm:Regular Rate:Normal     Neuro/Psych  Neuromuscular disease negative neurological ROS  negative psych ROS   GI/Hepatic negative GI ROS, Neg liver ROS,   Endo/Other  negative endocrine ROS  Renal/GU negative Renal ROS  negative genitourinary   Musculoskeletal negative musculoskeletal ROS (+)   Abdominal   Peds  Hematology negative hematology ROS (+)   Anesthesia Other Findings   Reproductive/Obstetrics negative OB ROS                           Anesthesia Physical Anesthesia Plan  ASA: I  Anesthesia Plan: General   Post-op Pain Management:    Induction: Intravenous  Airway Management Planned: Simple Face Mask  Additional Equipment:   Intra-op Plan:   Post-operative Plan: Extubation in OR  Informed Consent: I have reviewed the patients History and Physical, chart, labs and discussed the procedure including the risks, benefits and alternatives for the proposed anesthesia with the patient or authorized representative who has indicated his/her understanding and acceptance.   Dental advisory given  Plan Discussed with: CRNA  Anesthesia Plan Comments:         Anesthesia Quick Evaluation

## 2013-04-17 NOTE — Transfer of Care (Signed)
Immediate Anesthesia Transfer of Care Note  Patient: Alicia [name]  Procedure(s) Performed: Procedure(s): COLONOSCOPY WITH PROPOFOL (N/A)  Patient Location: PACU  Anesthesia Type:MAC  Level of Consciousness: sedated  Airway & Oxygen Therapy: Patient Spontanous Breathing and Patient connected to nasal cannula oxygen  Post-op Assessment: Report given to PACU RN and Post -op Vital signs reviewed and stable  Post vital signs: Reviewed and stable  Complications: No apparent anesthesia complications

## 2013-04-18 ENCOUNTER — Encounter (HOSPITAL_COMMUNITY): Payer: Self-pay | Admitting: Gastroenterology

## 2013-05-02 ENCOUNTER — Ambulatory Visit
Admission: RE | Admit: 2013-05-02 | Discharge: 2013-05-02 | Disposition: A | Discharge status: Home / Self Care | Payer: 59 | Source: Ambulatory Visit

## 2013-05-02 DIAGNOSIS — Z1231 Encounter for screening mammogram for malignant neoplasm of breast: Secondary | ICD-10-CM

## 2014-01-10 ENCOUNTER — Other Ambulatory Visit: Payer: Self-pay | Admitting: Obstetrics and Gynecology

## 2014-01-10 ENCOUNTER — Other Ambulatory Visit (HOSPITAL_COMMUNITY)
Admission: RE | Admit: 2014-01-10 | Discharge: 2014-01-10 | Disposition: A | Discharge status: Home / Self Care | Payer: 59 | Source: Ambulatory Visit | Attending: Obstetrics and Gynecology | Admitting: Obstetrics and Gynecology

## 2014-01-10 DIAGNOSIS — Z01419 Encounter for gynecological examination (general) (routine) without abnormal findings: Secondary | ICD-10-CM | POA: Insufficient documentation

## 2014-07-02 ENCOUNTER — Other Ambulatory Visit: Payer: Self-pay

## 2014-07-02 DIAGNOSIS — Z1231 Encounter for screening mammogram for malignant neoplasm of breast: Secondary | ICD-10-CM

## 2014-07-10 ENCOUNTER — Ambulatory Visit
Admission: RE | Admit: 2014-07-10 | Discharge: 2014-07-10 | Disposition: A | Discharge status: Home / Self Care | Payer: 59 | Source: Ambulatory Visit

## 2014-07-10 DIAGNOSIS — Z1231 Encounter for screening mammogram for malignant neoplasm of breast: Secondary | ICD-10-CM

## 2015-09-05 ENCOUNTER — Other Ambulatory Visit: Payer: Self-pay

## 2015-09-05 DIAGNOSIS — Z1231 Encounter for screening mammogram for malignant neoplasm of breast: Secondary | ICD-10-CM

## 2015-09-08 ENCOUNTER — Ambulatory Visit
Admission: RE | Admit: 2015-09-08 | Discharge: 2015-09-08 | Disposition: A | Discharge status: Home / Self Care | Payer: 59 | Source: Ambulatory Visit

## 2015-09-08 DIAGNOSIS — Z1231 Encounter for screening mammogram for malignant neoplasm of breast: Secondary | ICD-10-CM

## 2016-12-20 DIAGNOSIS — H52223 Regular astigmatism, bilateral: Secondary | ICD-10-CM | POA: Diagnosis not present

## 2017-01-12 ENCOUNTER — Other Ambulatory Visit: Payer: Self-pay | Admitting: Internal Medicine

## 2017-01-12 DIAGNOSIS — Z1231 Encounter for screening mammogram for malignant neoplasm of breast: Secondary | ICD-10-CM

## 2017-01-27 ENCOUNTER — Ambulatory Visit
Admission: RE | Admit: 2017-01-27 | Discharge: 2017-01-27 | Disposition: A | Discharge status: Home / Self Care | Payer: 59 | Source: Ambulatory Visit | Attending: Internal Medicine | Admitting: Internal Medicine

## 2017-01-27 DIAGNOSIS — Z1231 Encounter for screening mammogram for malignant neoplasm of breast: Secondary | ICD-10-CM

## 2017-01-31 DIAGNOSIS — Z1159 Encounter for screening for other viral diseases: Secondary | ICD-10-CM | POA: Diagnosis not present

## 2017-01-31 DIAGNOSIS — R7309 Other abnormal glucose: Secondary | ICD-10-CM | POA: Diagnosis not present

## 2017-01-31 DIAGNOSIS — E7800 Pure hypercholesterolemia, unspecified: Secondary | ICD-10-CM | POA: Diagnosis not present

## 2017-01-31 DIAGNOSIS — Z0000 Encounter for general adult medical examination without abnormal findings: Secondary | ICD-10-CM | POA: Diagnosis not present

## 2017-02-01 ENCOUNTER — Other Ambulatory Visit (HOSPITAL_COMMUNITY)
Admission: RE | Admit: 2017-02-01 | Discharge: 2017-02-01 | Disposition: A | Discharge status: Home / Self Care | Payer: 59 | Source: Ambulatory Visit | Attending: Obstetrics and Gynecology | Admitting: Obstetrics and Gynecology

## 2017-02-01 ENCOUNTER — Other Ambulatory Visit: Payer: Self-pay | Admitting: Obstetrics and Gynecology

## 2017-02-01 DIAGNOSIS — Z01419 Encounter for gynecological examination (general) (routine) without abnormal findings: Secondary | ICD-10-CM | POA: Diagnosis not present

## 2017-02-01 DIAGNOSIS — Z1151 Encounter for screening for human papillomavirus (HPV): Secondary | ICD-10-CM | POA: Insufficient documentation

## 2017-02-01 DIAGNOSIS — Z01411 Encounter for gynecological examination (general) (routine) with abnormal findings: Secondary | ICD-10-CM | POA: Diagnosis not present

## 2017-02-03 LAB — CYTOLOGY - PAP
DIAGNOSIS: NEGATIVE
HPV (WINDOPATH): NOT DETECTED

## 2017-03-07 DIAGNOSIS — Z780 Asymptomatic menopausal state: Secondary | ICD-10-CM | POA: Diagnosis not present

## 2017-07-06 DIAGNOSIS — L608 Other nail disorders: Secondary | ICD-10-CM | POA: Diagnosis not present

## 2018-02-01 DIAGNOSIS — R7303 Prediabetes: Secondary | ICD-10-CM | POA: Diagnosis not present

## 2018-02-01 DIAGNOSIS — E7800 Pure hypercholesterolemia, unspecified: Secondary | ICD-10-CM | POA: Diagnosis not present

## 2018-02-01 DIAGNOSIS — Z0000 Encounter for general adult medical examination without abnormal findings: Secondary | ICD-10-CM | POA: Diagnosis not present

## 2018-02-02 ENCOUNTER — Other Ambulatory Visit: Payer: Self-pay | Admitting: Obstetrics and Gynecology

## 2018-02-02 DIAGNOSIS — Z1231 Encounter for screening mammogram for malignant neoplasm of breast: Secondary | ICD-10-CM

## 2018-02-03 DIAGNOSIS — Z01419 Encounter for gynecological examination (general) (routine) without abnormal findings: Secondary | ICD-10-CM | POA: Diagnosis not present

## 2018-02-09 ENCOUNTER — Ambulatory Visit
Admission: RE | Admit: 2018-02-09 | Discharge: 2018-02-09 | Disposition: A | Discharge status: Home / Self Care | Payer: 59 | Source: Ambulatory Visit | Attending: Obstetrics and Gynecology | Admitting: Obstetrics and Gynecology

## 2018-02-09 DIAGNOSIS — Z1231 Encounter for screening mammogram for malignant neoplasm of breast: Secondary | ICD-10-CM | POA: Diagnosis not present

## 2018-04-12 DIAGNOSIS — H524 Presbyopia: Secondary | ICD-10-CM | POA: Diagnosis not present

## 2019-03-27 ENCOUNTER — Other Ambulatory Visit: Payer: Self-pay | Admitting: Obstetrics and Gynecology

## 2019-03-27 DIAGNOSIS — Z1231 Encounter for screening mammogram for malignant neoplasm of breast: Secondary | ICD-10-CM

## 2019-05-09 ENCOUNTER — Ambulatory Visit
Admission: RE | Admit: 2019-05-09 | Discharge: 2019-05-09 | Disposition: A | Discharge status: Home / Self Care | Payer: 59 | Source: Ambulatory Visit | Attending: Obstetrics and Gynecology | Admitting: Obstetrics and Gynecology

## 2019-05-09 ENCOUNTER — Other Ambulatory Visit: Payer: Self-pay

## 2019-05-09 DIAGNOSIS — Z1231 Encounter for screening mammogram for malignant neoplasm of breast: Secondary | ICD-10-CM

## 2019-10-03 ENCOUNTER — Other Ambulatory Visit: Payer: Self-pay | Admitting: Cardiology

## 2019-10-03 DIAGNOSIS — Z20822 Contact with and (suspected) exposure to covid-19: Secondary | ICD-10-CM

## 2019-10-04 LAB — NOVEL CORONAVIRUS, NAA: SARS-CoV-2, NAA: NOT DETECTED

## 2019-12-06 ENCOUNTER — Ambulatory Visit: Payer: 59 | Attending: Internal Medicine

## 2019-12-06 DIAGNOSIS — Z23 Encounter for immunization: Secondary | ICD-10-CM | POA: Insufficient documentation

## 2019-12-06 NOTE — Progress Notes (Signed)
   Covid-19 Vaccination Clinic  Name:  Alicia [name]    MRN: [id number] DOB: [birth year]  12/06/2019  Alicia [name] was observed post Covid-19 immunization for 15 minutes without incident. She was provided with Vaccine Information Sheet and instruction to access the V-Safe system.   Alicia [name] was instructed to call 911 with any severe reactions post vaccine: Marland Kitchen Difficulty breathing  . Swelling of face and throat  . A fast heartbeat  . A bad rash all over body  . Dizziness and weakness   Immunizations Administered    Name Date Dose VIS Date Route   Pfizer COVID-19 Vaccine 12/06/2019  6:14 PM 0.3 mL 09/14/2019 Intramuscular   Manufacturer: [employer], [employer]   Lot: [id number]   NDC: [address]-4

## 2020-01-02 ENCOUNTER — Ambulatory Visit: Payer: 59 | Attending: Internal Medicine

## 2020-01-02 DIAGNOSIS — Z23 Encounter for immunization: Secondary | ICD-10-CM

## 2020-01-02 NOTE — Progress Notes (Signed)
   Covid-19 Vaccination Clinic  Name:  Alicia [name]    MRN: [id number] DOB: [birth year]  01/02/2020  [name] was observed post Covid-19 immunization for 15 minutes without incident. She was provided with Vaccine Information Sheet and instruction to access the V-Safe system.   [name] was instructed to call 911 with any severe reactions post vaccine: Marland Kitchen Difficulty breathing  . Swelling of face and throat  . A fast heartbeat  . A bad rash all over body  . Dizziness and weakness   Immunizations Administered    Name Date Dose VIS Date Route   Pfizer COVID-19 Vaccine 01/02/2020  4:30 PM 0.3 mL 09/14/2019 Intramuscular   Manufacturer: [employer], [employer]   Lot: [id number]   NDC: [address]-7

## 2020-03-22 ENCOUNTER — Emergency Department (HOSPITAL_COMMUNITY)
Admission: EM | Admit: 2020-03-22 | Discharge: 2020-03-23 | Disposition: A | Discharge status: Home / Self Care | Payer: 59 | Attending: Emergency Medicine | Admitting: Emergency Medicine

## 2020-03-22 DIAGNOSIS — Z5321 Procedure and treatment not carried out due to patient leaving prior to being seen by health care provider: Secondary | ICD-10-CM | POA: Diagnosis not present

## 2020-03-22 DIAGNOSIS — R109 Unspecified abdominal pain: Secondary | ICD-10-CM | POA: Diagnosis not present

## 2020-03-23 ENCOUNTER — Encounter (HOSPITAL_COMMUNITY): Payer: Self-pay

## 2020-03-23 ENCOUNTER — Other Ambulatory Visit: Payer: Self-pay

## 2020-03-23 ENCOUNTER — Emergency Department (HOSPITAL_COMMUNITY)
Admission: EM | Admit: 2020-03-23 | Discharge: 2020-03-23 | Disposition: A | Discharge status: Home / Self Care | Payer: 59 | Source: Home / Self Care | Attending: Emergency Medicine | Admitting: Emergency Medicine

## 2020-03-23 ENCOUNTER — Emergency Department (HOSPITAL_COMMUNITY): Payer: 59

## 2020-03-23 DIAGNOSIS — N1330 Unspecified hydronephrosis: Secondary | ICD-10-CM | POA: Insufficient documentation

## 2020-03-23 DIAGNOSIS — N200 Calculus of kidney: Secondary | ICD-10-CM

## 2020-03-23 DIAGNOSIS — Z87441 Personal history of nephrotic syndrome: Secondary | ICD-10-CM | POA: Insufficient documentation

## 2020-03-23 DIAGNOSIS — R112 Nausea with vomiting, unspecified: Secondary | ICD-10-CM | POA: Insufficient documentation

## 2020-03-23 DIAGNOSIS — R109 Unspecified abdominal pain: Secondary | ICD-10-CM

## 2020-03-23 DIAGNOSIS — Z79899 Other long term (current) drug therapy: Secondary | ICD-10-CM | POA: Insufficient documentation

## 2020-03-23 LAB — URINALYSIS, ROUTINE W REFLEX MICROSCOPIC
Bilirubin Urine: NEGATIVE
Bilirubin Urine: NEGATIVE
Glucose, UA: NEGATIVE mg/dL
Glucose, UA: NEGATIVE mg/dL
Hgb urine dipstick: NEGATIVE
Ketones, ur: 5 mg/dL — AB
Ketones, ur: NEGATIVE mg/dL
Leukocytes,Ua: NEGATIVE
Leukocytes,Ua: NEGATIVE
Nitrite: NEGATIVE
Nitrite: NEGATIVE
Protein, ur: NEGATIVE mg/dL
Protein, ur: NEGATIVE mg/dL
Specific Gravity, Urine: 1.013 (ref 1.005–1.030)
Specific Gravity, Urine: 1.026 (ref 1.005–1.030)
pH: 5 (ref 5.0–8.0)
pH: 6 (ref 5.0–8.0)

## 2020-03-23 LAB — PREGNANCY, URINE: Preg Test, Ur: NEGATIVE

## 2020-03-23 MED ORDER — ONDANSETRON HCL 4 MG/2ML IJ SOLN
4.0000 mg | Freq: Once | INTRAMUSCULAR | Status: AC
  Administered 2020-03-23: 4 mg via INTRAVENOUS
  Filled 2020-03-23: qty 2

## 2020-03-23 MED ORDER — TAMSULOSIN HCL 0.4 MG PO CAPS: 0.4000 mg | ORAL_CAPSULE | Freq: Every day | ORAL | 0 refills | Status: DC

## 2020-03-23 MED ORDER — KETOROLAC TROMETHAMINE 30 MG/ML IJ SOLN
30.0000 mg | Freq: Once | INTRAMUSCULAR | Status: AC
  Administered 2020-03-23: 30 mg via INTRAVENOUS
  Filled 2020-03-23: qty 1

## 2020-03-23 MED ORDER — HYDROCODONE-ACETAMINOPHEN 5-325 MG PO TABS: 1.0000 | ORAL_TABLET | Freq: Four times a day (QID) | ORAL | 0 refills | Status: DC | PRN

## 2020-03-23 NOTE — ED Notes (Signed)
Pt said she is leaving Encouraged pt stay

## 2020-03-23 NOTE — ED Triage Notes (Signed)
Pt presents to ED POv. Pt c/o L flank pain that radiates towards front. Pt states she's had kidney stones before and feeles the same.

## 2020-03-23 NOTE — Discharge Instructions (Signed)
You have been treated for a kidney stone.  I placed you on Flomax please take as prescribed this will help pass your stone.  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  I also want you to take ibuprofen every 6 hours for pain please follow dosing on the bottle.  Drink plenty of water as hydration will help pass the stone.  I have given you a urologist please make an appointment for further evaluation and management of your stones.  I want you to come back to the emergency department if you develop severe uncontrolled abdominal pain, fever, chills, see blood in your urine, chest pain, shortness of breath that the symptoms require further evaluation.

## 2020-03-23 NOTE — ED Provider Notes (Signed)
[facility] EMERGENCY DEPARTMENT Provider Note   CSN: [id number] Arrival date & time: 03/23/20  1656     History Chief Complaint  Patient presents with  . Flank Pain    Alicia [name] is a 59 y.o. female.  HPI   Patient presents emerged department chief complaint of left flank pain that started yesterday.  Patient states while she was outside gardening she felt a sharp pain in her left flank.  Patient describes this pain as episodic, comes and goes without any alleviating or aggravating factors.  She describes the pain as sharp in nature and feels like it radiates to her stomach.  Patient admits that she has had kidney stones in the past and feels like this is another kidney stone.  Patient denies any urinary symptoms, no vaginal discharge or bleeding, denies fever or chills.  Patient does admit to one episode of vomiting as well as intermittent nausea.  She has been able to hold food and liquids down today.  Patient has no significant medical history, does not take any medication on a daily basis.  Patient denies smoking, drinking alcohol, use of drugs.  Patient denies fever, chills, headache, sore throat, chest pain, shortness of breath, difficulty urinating, diarrhea, leg swelling.  Past Medical History:  Diagnosis Date  . Heart murmur    child  . Herpes   . History of kidney stones April 10, 2013   history of x2 -passed on own  . Medical history non-contributory   . Neuromuscular disorder ([place])    lt foot numbness    There are no problems to display for this patient.   Past Surgical History:  Procedure Laterality Date  . COLONOSCOPY WITH PROPOFOL N/A 04/17/2013   Procedure: COLONOSCOPY WITH PROPOFOL;  Surgeon: Garlan Fair, MD;  Location: [facility];  Service: Endoscopy;  Laterality: N/A;  . LIPOMA EXCISION  2007   lt shouldr  . TARSAL TUNNEL RELEASE Left 01/25/2013   Procedure: LEFT FOOT RELEASE TARSAL TUNNEL (POST TIBIAL NERVE DECOMPRESS);  Surgeon:  Ninetta Lights, MD;  Location: [facility];  Service: Orthopedics;  Laterality: Left;  . UTERINE FIBROID SURGERY  2005     OB History   No obstetric history on file.     History reviewed. No pertinent family history.  Social History   Tobacco Use  . Smoking status: Never Smoker  . Smokeless tobacco: Never Used  Substance Use Topics  . Alcohol use: Yes    Comment: occ  . Drug use: No    Home Medications Prior to Admission medications   Medication Sig Start Date End Date Taking? Authorizing Provider  cholecalciferol (VITAMIN D) 1000 UNITS tablet Take 1,000 Units by mouth daily.    [provider]  famciclovir (FAMVIR) 500 MG tablet Take 500 mg by mouth daily.    [provider]  FIBER SELECT GUMMIES CHEW Chew 1 tablet by mouth daily.    [provider]  HYDROcodone-acetaminophen (NORCO/VICODIN) 5-325 MG tablet Take 1 tablet by mouth every 6 (six) hours as needed. 03/23/20   Marcello Fennel, PA-C  Multiple Vitamins-Minerals (MULTIVITAMIN WITH MINERALS) tablet Take 1 tablet by mouth daily. Alive    [provider]  tamsulosin (FLOMAX) 0.4 MG CAPS capsule Take 1 capsule (0.4 mg total) by mouth daily. 03/23/20   Marcello Fennel, PA-C    Allergies    Patient has no known allergies.  Review of Systems   Review of Systems  Constitutional: Negative for chills  and fever.  HENT: Negative for congestion and sore throat.   Eyes: Negative for redness.  Respiratory: Negative for cough and shortness of breath.   Cardiovascular: Negative for chest pain and leg swelling.  Gastrointestinal: Positive for abdominal pain, nausea and vomiting. Negative for constipation.       Admits to left-sided intermittent abdominal pain.   Genitourinary: Positive for flank pain and frequency. Negative for difficulty urinating, dysuria, enuresis, genital sores, hematuria, pelvic pain, vaginal bleeding, vaginal discharge and vaginal pain.       Patient  admitts to urinary frequency as well as flank pain.  Musculoskeletal: Negative for back pain.  Skin: Negative for rash.  Neurological: Negative for dizziness.  Hematological: Does not bruise/bleed easily.    Physical Exam Updated Vital Signs BP (!) 150/100 (BP Location: Right Arm)   Pulse 73   Temp 98.4 F (36.9 C) (Oral)   Resp 14   Ht 5\' 8"  (1.727 m)   Wt 86.2 kg   LMP 01/09/2013   SpO2 99%   BMI 28.89 kg/m   Physical Exam Vitals and nursing note reviewed.  Constitutional:      General: She is not in acute distress.    Appearance: She is not ill-appearing.  HENT:     Head: Normocephalic and atraumatic.     Nose: No congestion.     Mouth/Throat:     Mouth: Mucous membranes are moist.     Pharynx: Oropharynx is clear.  Eyes:     General: No scleral icterus. Cardiovascular:     Rate and Rhythm: Normal rate and regular rhythm.     Pulses: Normal pulses.     Heart sounds: No murmur heard.  No friction rub. No gallop.   Pulmonary:     Effort: No respiratory distress.     Breath sounds: No wheezing, rhonchi or rales.  Abdominal:     General: There is no distension.     Tenderness: There is no abdominal tenderness. There is no right CVA tenderness, left CVA tenderness or guarding.     Comments: Patient's abdomen was nondistended, normoactive bowel sounds, dull to percussion, tenderness to palpation along her left mid abdomen/flank.  There is no peritoneal sign no acute abdomen, no rebound tenderness.  Musculoskeletal:        General: No swelling.  Skin:    General: Skin is warm and dry.     Findings: No rash.  Neurological:     Mental Status: She is alert.  Psychiatric:        Mood and Affect: Mood normal.     ED Results / Procedures / Treatments   Labs (all labs ordered are listed, but only abnormal results are displayed) Labs Reviewed  URINALYSIS, ROUTINE W REFLEX MICROSCOPIC - Abnormal; Notable for the following components:      Result Value   Ketones, ur  5 (*)    All other components within normal limits  PREGNANCY, URINE    EKG None  Radiology CT Renal Stone Study  Result Date: 03/23/2020 CLINICAL DATA:  59 year old female with flank pain. Concern for kidney stone. EXAM: CT ABDOMEN AND PELVIS WITHOUT CONTRAST TECHNIQUE: Multidetector CT imaging of the abdomen and pelvis was performed following the standard protocol without IV contrast. COMPARISON:  None. FINDINGS: Evaluation of this exam is limited in the absence of intravenous contrast. Lower chest: There is a 4 mm subpleural nodule at the right lung base. The visualized lung bases are otherwise clear. No intra-abdominal free air or free  fluid. Hepatobiliary: No focal liver abnormality is seen. No gallstones, gallbladder wall thickening, or biliary dilatation. Pancreas: Unremarkable. No pancreatic ductal dilatation or surrounding inflammatory changes. Spleen: Normal in size without focal abnormality. Adrenals/Urinary Tract: The adrenal glands are unremarkable. There is a 5 mm stone along the left posterior bladder wall adjacent to the left ureterovesical junction which may represent a left UVJ stone or a recently passed left renal calculus. There is mild left hydronephrosis. There is a 3 mm nonobstructing right renal upper pole calculus. No hydronephrosis on the right. The visualized right ureter appears unremarkable. Stomach/Bowel: There is a 2 cm duodenal diverticulum. There is no bowel obstruction or active inflammation. The appendix is normal. Vascular/Lymphatic: Mild aortoiliac atherosclerotic disease. The IVC is unremarkable. No portal venous gas. There is no adenopathy. Reproductive: The uterus and ovaries are grossly unremarkable. Other: None Musculoskeletal: No acute osseous pathology. IMPRESSION: 1. A 5 mm left UVJ stone versus a recently passed left renal calculus with mild left hydronephrosis. 2. A 3 mm nonobstructing right renal upper pole calculus. No hydronephrosis on the right. 3. No  bowel obstruction. Normal appendix. 4. Aortic Atherosclerosis (ICD10-I70.0). Electronically Signed   By: Elgie Collard M.D.   On: 03/23/2020 20:42    Procedures Procedures (including critical care time)  Medications Ordered in ED Medications  ketorolac (TORADOL) 30 MG/ML injection 30 mg (30 mg Intravenous Given 03/23/20 2039)  ondansetron (ZOFRAN) injection 4 mg (4 mg Intravenous Given 03/23/20 2038)    ED Course  I have reviewed the triage vital signs and the nursing notes.  Pertinent labs & imaging results that were available during my care of the patient were reviewed by me and considered in my medical decision making (see chart for details).    MDM Rules/Calculators/A&P                          I have personally reviewed all imaging, labs and have interpreted them.  Due to patient's chief complaint of flank pain most concern for kidney stone versus pyelonephritis versus diverticulitis.  Unlikely that patient is suffering from diverticulitis as patient denies left lower abdominal pain, physical exam did not show any tenderness in the left lower quadrant.  Unlikely that patient is suffering from pyelonephritis/UTI as patient denies any urinary symptoms, patient is afebrile, nontachycardic.  Urine analysis was negative for leukocytes and nitrites making UTI and pyelonephritis unlikely.  CT renal stone study showed a 5 mm left UVJ stone versus a recently passed left renal colic with mild left hydronephrosis. there is also a 3 mm nonobstructing stone in the right renal upper pole with no hydronephrosis.  Patient was given pain meds and nausea meds which she responded well to.  Patient does not appear to be in any acute distress, is resting comfortably in bed.  Vitals have remained stable patient does not meet criteria to be admitted to the hospital.  Likely that patient's pain was a result of a kidney stone which appears to have passed.  Patient will be placed on Flomax as well as given pain  meds, given at home instructions as well as strict return precautions.  Patient was assessed by attending who agrees with assessment and plan.  Patient was explained the results and plan, she verbalized that she understood and agrees with said plan. Final Clinical Impression(s) / ED Diagnoses Final diagnoses:  Kidney stone  Left flank pain    Rx / DC Orders ED Discharge Orders  Ordered    HYDROcodone-acetaminophen (NORCO/VICODIN) 5-325 MG tablet  Every 6 hours PRN     Discontinue  Reprint     03/23/20 2118    tamsulosin (FLOMAX) 0.4 MG CAPS capsule  Daily     Discontinue  Reprint     03/23/20 2118           Carroll Sage, PA-C 03/23/20 2236    Wynetta Fines, MD 03/27/20 [phone]

## 2020-03-23 NOTE — ED Notes (Signed)
The pt returned from c-t 

## 2020-03-23 NOTE — ED Triage Notes (Signed)
Pt presents w/Left flank pain x1 day, reports a hx of kidney stones and her symptoms feel the same. Pt seen here yesterday and while waiting for a room her pain passed.

## 2020-04-14 ENCOUNTER — Other Ambulatory Visit: Payer: Self-pay | Admitting: Obstetrics and Gynecology

## 2020-04-14 ENCOUNTER — Other Ambulatory Visit: Payer: Self-pay | Admitting: Physician Assistant

## 2020-04-14 DIAGNOSIS — Z1231 Encounter for screening mammogram for malignant neoplasm of breast: Secondary | ICD-10-CM

## 2020-05-09 ENCOUNTER — Ambulatory Visit: Payer: 59

## 2020-05-14 ENCOUNTER — Other Ambulatory Visit: Payer: Self-pay

## 2020-05-14 ENCOUNTER — Ambulatory Visit
Admission: RE | Admit: 2020-05-14 | Discharge: 2020-05-14 | Disposition: A | Discharge status: Home / Self Care | Payer: 59 | Source: Ambulatory Visit | Attending: Obstetrics and Gynecology | Admitting: Obstetrics and Gynecology

## 2020-05-14 DIAGNOSIS — Z1231 Encounter for screening mammogram for malignant neoplasm of breast: Secondary | ICD-10-CM

## 2020-05-15 ENCOUNTER — Other Ambulatory Visit: Payer: Self-pay | Admitting: Obstetrics and Gynecology

## 2020-05-15 DIAGNOSIS — R928 Other abnormal and inconclusive findings on diagnostic imaging of breast: Secondary | ICD-10-CM

## 2020-06-02 ENCOUNTER — Other Ambulatory Visit: Payer: Self-pay | Admitting: Obstetrics and Gynecology

## 2020-06-02 ENCOUNTER — Other Ambulatory Visit: Payer: Self-pay

## 2020-06-02 ENCOUNTER — Ambulatory Visit
Admission: RE | Admit: 2020-06-02 | Discharge: 2020-06-02 | Disposition: A | Discharge status: Home / Self Care | Payer: 59 | Source: Ambulatory Visit | Attending: Obstetrics and Gynecology | Admitting: Obstetrics and Gynecology

## 2020-06-02 DIAGNOSIS — R928 Other abnormal and inconclusive findings on diagnostic imaging of breast: Secondary | ICD-10-CM

## 2020-06-04 ENCOUNTER — Ambulatory Visit
Admission: RE | Admit: 2020-06-04 | Discharge: 2020-06-04 | Disposition: A | Discharge status: Home / Self Care | Payer: 59 | Source: Ambulatory Visit | Attending: Obstetrics and Gynecology | Admitting: Obstetrics and Gynecology

## 2020-06-04 ENCOUNTER — Other Ambulatory Visit: Payer: Self-pay

## 2020-06-04 DIAGNOSIS — R928 Other abnormal and inconclusive findings on diagnostic imaging of breast: Secondary | ICD-10-CM

## 2020-06-18 ENCOUNTER — Other Ambulatory Visit: Payer: Self-pay | Admitting: Surgery

## 2020-06-18 DIAGNOSIS — N6021 Fibroadenosis of right breast: Secondary | ICD-10-CM

## 2020-07-01 ENCOUNTER — Other Ambulatory Visit: Payer: Self-pay | Admitting: Surgery

## 2020-07-01 DIAGNOSIS — N6021 Fibroadenosis of right breast: Secondary | ICD-10-CM

## 2020-07-29 ENCOUNTER — Other Ambulatory Visit: Payer: Self-pay

## 2020-07-29 ENCOUNTER — Encounter (HOSPITAL_BASED_OUTPATIENT_CLINIC_OR_DEPARTMENT_OTHER): Payer: Self-pay | Admitting: Surgery

## 2020-07-31 MED ORDER — ENSURE PRE-SURGERY PO LIQD: 296.0000 mL | Freq: Once | ORAL | Status: DC

## 2020-07-31 NOTE — Progress Notes (Signed)

## 2020-08-02 ENCOUNTER — Inpatient Hospital Stay (HOSPITAL_COMMUNITY): Admission: RE | Admit: 2020-08-02 | Payer: 59 | Source: Ambulatory Visit

## 2020-08-04 ENCOUNTER — Other Ambulatory Visit (HOSPITAL_COMMUNITY)
Admission: RE | Admit: 2020-08-04 | Discharge: 2020-08-04 | Disposition: A | Discharge status: Home / Self Care | Payer: 59 | Source: Ambulatory Visit | Attending: Surgery | Admitting: Surgery

## 2020-08-04 DIAGNOSIS — Z20822 Contact with and (suspected) exposure to covid-19: Secondary | ICD-10-CM | POA: Insufficient documentation

## 2020-08-04 DIAGNOSIS — Z01812 Encounter for preprocedural laboratory examination: Secondary | ICD-10-CM | POA: Insufficient documentation

## 2020-08-04 LAB — SARS CORONAVIRUS 2 (TAT 6-24 HRS): SARS Coronavirus 2: NEGATIVE

## 2020-08-05 ENCOUNTER — Ambulatory Visit
Admission: RE | Admit: 2020-08-05 | Discharge: 2020-08-05 | Disposition: A | Discharge status: Home / Self Care | Payer: 59 | Source: Ambulatory Visit | Attending: Surgery | Admitting: Surgery

## 2020-08-05 ENCOUNTER — Other Ambulatory Visit: Payer: Self-pay

## 2020-08-05 DIAGNOSIS — N6021 Fibroadenosis of right breast: Secondary | ICD-10-CM

## 2020-08-05 NOTE — H&P (Signed)
Alicia [name]  Location: [facility] Patient #: [id number] DOB: 07-03-1961 Married / Language: English / Race: Black or African American Female   History of Present Illness   The patient is a 59 year old female who presents with a complaint of Breast problems.  Chief complaint: Right breast abnormality  This is a 59 year old female who was found on recent screening mammography to have distortion in the right breast. She underwent further mammograms and ultrasound. She had the area biopsied showing a complex sclerosing lesion with adenosis. There was no signs of malignancy. She has had no previous problems with her breasts. There is no family history of breast cancer. She denies nipple discharge. She is otherwise fairly healthy and without complaints.   Diagnostic Studies History Colonoscopy  5-10 years ago Mammogram  within last year Pap Smear  1-5 years ago  Allergies  HYDROcodone Bitartrate *CHEMICALS*  Allergies Reconciled   Medication History Famvir (500MG  Tablet, Oral) Active. ZyrTEC (Oral) Specific strength unknown - Active. Vitamin B12 (Oral) Specific strength unknown - Active. Vitamin C (Oral) Specific strength unknown - Active. Vitamin D3 (Oral) Specific strength unknown - Active. Vitamin E (Oral) Specific strength unknown - Active. Zinc (Oral) Specific strength unknown - Active. Medications Reconciled  Social History Alcohol use  Occasional alcohol use. Caffeine use  Tea. No drug use  Tobacco use  Never smoker.  Family History  Hypertension  Mother, Sister. Kidney Disease  Mother.  Pregnancy / Birth History  Age at menarche  14 years. Age of menopause  62-55 Gravida  2 Irregular periods  Maternal age  75-30 Para  0  Other Problems Heart murmur  Kidney Stone  Lump In Breast     Review of Systems  General Not Present- Appetite Loss, Chills, Fatigue, Fever, Night Sweats, Weight Gain and Weight  Loss. Skin Not Present- Change in Wart/Mole, Dryness, Hives, Jaundice, New Lesions, Non-Healing Wounds, Rash and Ulcer. HEENT Present- Wears glasses/contact lenses. Not Present- Earache, Hearing Loss, Hoarseness, Nose Bleed, Oral Ulcers, Ringing in the Ears, Seasonal Allergies, Sinus Pain, Sore Throat, Visual Disturbances and Yellow Eyes. Respiratory Not Present- Bloody sputum, Chronic Cough, Difficulty Breathing, Snoring and Wheezing. Cardiovascular Not Present- Chest Pain, Difficulty Breathing Lying Down, Leg Cramps, Palpitations, Rapid Heart Rate, Shortness of Breath and Swelling of Extremities. Female Genitourinary Present- Nocturia. Not Present- Frequency, Painful Urination, Pelvic Pain and Urgency. Musculoskeletal Present- Joint Pain. Not Present- Back Pain, Joint Stiffness, Muscle Pain, Muscle Weakness and Swelling of Extremities. Neurological Present- Numbness. Not Present- Decreased Memory, Fainting, Headaches, Seizures, Tingling, Tremor, Trouble walking and Weakness. Psychiatric Not Present- Anxiety, Bipolar, Change in Sleep Pattern, Depression, Fearful and Frequent crying. Endocrine Not Present- Cold Intolerance, Excessive Hunger, Hair Changes, Heat Intolerance, Hot flashes and New Diabetes. Hematology Not Present- Blood Thinners, Easy Bruising, Excessive bleeding, Gland problems, HIV and Persistent Infections.  Vitals  Weight: 192.5 lb Height: 68in Body Surface Area: 2.01 m Body Mass Index: 29.27 kg/m  Temp.: 93F  Pulse: 86 (Regular)  BP: 132/80(Sitting, Left Arm, Standard)     Physical Exam  The physical exam findings are as follows: Note: She appears well on exam  Breasts appear normal bilaterally  There are no operable masses in either breast and no axillary adenopathy.    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 I have reviewed her mammograms and  ultrasound. I have reviewed her pathology results. I gave her  a copy of the pathology results. She has a right breast complex sclerosing lesion. I discussed the diagnosis with her and her husband in detail. It is recommended we proceed with removal of this area in the operating room for complete histologic evaluation to rule out a malignancy. I discussed the reasons for this with him in detail. This will be done with a right breast radioactive seed guided lumpectomy. I discussed the surgical procedure in detail. I discussed the risk which includes but is not limited to bleeding, infection, injury to surrounding structures, the need for further surgery if malignancy is found, cardiopulmonary issues, postoperative recovery, etc. They understand and wish to proceed with surgery which will be scheduled.

## 2020-08-06 ENCOUNTER — Encounter (HOSPITAL_BASED_OUTPATIENT_CLINIC_OR_DEPARTMENT_OTHER)
Admission: RE | Disposition: A | Discharge status: Home / Self Care | Payer: Self-pay | Source: Home / Self Care | Attending: Surgery

## 2020-08-06 ENCOUNTER — Ambulatory Visit
Admission: RE | Admit: 2020-08-06 | Discharge: 2020-08-06 | Disposition: A | Discharge status: Home / Self Care | Payer: 59 | Source: Ambulatory Visit | Attending: Surgery | Admitting: Surgery

## 2020-08-06 ENCOUNTER — Encounter (HOSPITAL_BASED_OUTPATIENT_CLINIC_OR_DEPARTMENT_OTHER): Payer: Self-pay | Admitting: Surgery

## 2020-08-06 ENCOUNTER — Ambulatory Visit (HOSPITAL_BASED_OUTPATIENT_CLINIC_OR_DEPARTMENT_OTHER): Payer: 59 | Admitting: Anesthesiology

## 2020-08-06 ENCOUNTER — Ambulatory Visit (HOSPITAL_BASED_OUTPATIENT_CLINIC_OR_DEPARTMENT_OTHER)
Admission: RE | Admit: 2020-08-06 | Discharge: 2020-08-06 | Disposition: A | Discharge status: Home / Self Care | Payer: 59 | Attending: Surgery | Admitting: Surgery

## 2020-08-06 DIAGNOSIS — N6489 Other specified disorders of breast: Secondary | ICD-10-CM | POA: Diagnosis not present

## 2020-08-06 DIAGNOSIS — N641 Fat necrosis of breast: Secondary | ICD-10-CM | POA: Insufficient documentation

## 2020-08-06 DIAGNOSIS — N6021 Fibroadenosis of right breast: Secondary | ICD-10-CM

## 2020-08-06 HISTORY — PX: BREAST LUMPECTOMY WITH RADIOACTIVE SEED LOCALIZATION: SHX6424

## 2020-08-06 SURGERY — BREAST LUMPECTOMY WITH RADIOACTIVE SEED LOCALIZATION
Anesthesia: General | Site: Breast | Laterality: Right

## 2020-08-06 MED ORDER — BUPIVACAINE HCL (PF) 0.5 % IJ SOLN
INTRAMUSCULAR | Status: AC
  Filled 2020-08-06: qty 120

## 2020-08-06 MED ORDER — BUPIVACAINE HCL 0.5 % IJ SOLN
INTRAMUSCULAR | Status: DC | PRN
  Administered 2020-08-06: 20 mL

## 2020-08-06 MED ORDER — CELECOXIB 200 MG PO CAPS
400.0000 mg | ORAL_CAPSULE | ORAL | Status: AC
  Administered 2020-08-06: 400 mg via ORAL

## 2020-08-06 MED ORDER — GABAPENTIN 300 MG PO CAPS
ORAL_CAPSULE | ORAL | Status: AC
  Filled 2020-08-06: qty 1

## 2020-08-06 MED ORDER — EPHEDRINE SULFATE 50 MG/ML IJ SOLN
INTRAMUSCULAR | Status: DC | PRN
  Administered 2020-08-06: 10 mg via INTRAVENOUS
  Administered 2020-08-06: 5 mg via INTRAVENOUS
  Administered 2020-08-06 (×2): 10 mg via INTRAVENOUS

## 2020-08-06 MED ORDER — ACETAMINOPHEN 500 MG PO TABS
1000.0000 mg | ORAL_TABLET | ORAL | Status: AC
  Administered 2020-08-06: 1000 mg via ORAL

## 2020-08-06 MED ORDER — MIDAZOLAM HCL 2 MG/2ML IJ SOLN
INTRAMUSCULAR | Status: AC
  Filled 2020-08-06: qty 2

## 2020-08-06 MED ORDER — MIDAZOLAM HCL 5 MG/5ML IJ SOLN
INTRAMUSCULAR | Status: DC | PRN
  Administered 2020-08-06: 2 mg via INTRAVENOUS

## 2020-08-06 MED ORDER — DEXAMETHASONE SODIUM PHOSPHATE 4 MG/ML IJ SOLN
INTRAMUSCULAR | Status: DC | PRN
  Administered 2020-08-06: 5 mg via INTRAVENOUS

## 2020-08-06 MED ORDER — LACTATED RINGERS IV SOLN: INTRAVENOUS | Status: DC

## 2020-08-06 MED ORDER — CHLORHEXIDINE GLUCONATE CLOTH 2 % EX PADS: 6.0000 | MEDICATED_PAD | Freq: Once | CUTANEOUS | Status: DC

## 2020-08-06 MED ORDER — ONDANSETRON HCL 4 MG/2ML IJ SOLN
INTRAMUSCULAR | Status: DC | PRN
  Administered 2020-08-06: 4 mg via INTRAVENOUS

## 2020-08-06 MED ORDER — LIDOCAINE 2% (20 MG/ML) 5 ML SYRINGE
INTRAMUSCULAR | Status: AC
  Filled 2020-08-06: qty 5

## 2020-08-06 MED ORDER — FENTANYL CITRATE (PF) 100 MCG/2ML IJ SOLN
INTRAMUSCULAR | Status: DC | PRN
  Administered 2020-08-06: 25 ug via INTRAVENOUS
  Administered 2020-08-06: 50 ug via INTRAVENOUS
  Administered 2020-08-06: 25 ug via INTRAVENOUS

## 2020-08-06 MED ORDER — EPHEDRINE 5 MG/ML INJ
INTRAVENOUS | Status: AC
  Filled 2020-08-06: qty 20

## 2020-08-06 MED ORDER — ACETAMINOPHEN 500 MG PO TABS
ORAL_TABLET | ORAL | Status: AC
  Filled 2020-08-06: qty 2

## 2020-08-06 MED ORDER — CEFAZOLIN SODIUM-DEXTROSE 2-4 GM/100ML-% IV SOLN
INTRAVENOUS | Status: AC
  Filled 2020-08-06: qty 100

## 2020-08-06 MED ORDER — BUPIVACAINE HCL (PF) 0.25 % IJ SOLN
INTRAMUSCULAR | Status: AC
  Filled 2020-08-06: qty 60

## 2020-08-06 MED ORDER — ONDANSETRON HCL 4 MG/2ML IJ SOLN: 4.0000 mg | Freq: Once | INTRAMUSCULAR | Status: DC | PRN

## 2020-08-06 MED ORDER — PHENYLEPHRINE 40 MCG/ML (10ML) SYRINGE FOR IV PUSH (FOR BLOOD PRESSURE SUPPORT)
PREFILLED_SYRINGE | INTRAVENOUS | Status: AC
  Filled 2020-08-06: qty 10

## 2020-08-06 MED ORDER — CEFAZOLIN SODIUM-DEXTROSE 2-4 GM/100ML-% IV SOLN
2.0000 g | INTRAVENOUS | Status: AC
  Administered 2020-08-06: 2 g via INTRAVENOUS

## 2020-08-06 MED ORDER — DEXAMETHASONE SODIUM PHOSPHATE 10 MG/ML IJ SOLN
INTRAMUSCULAR | Status: AC
  Filled 2020-08-06: qty 1

## 2020-08-06 MED ORDER — SODIUM BICARBONATE 4.2 % IV SOLN
INTRAVENOUS | Status: AC
  Filled 2020-08-06: qty 20

## 2020-08-06 MED ORDER — ONDANSETRON HCL 4 MG/2ML IJ SOLN
INTRAMUSCULAR | Status: AC
  Filled 2020-08-06: qty 2

## 2020-08-06 MED ORDER — CELECOXIB 200 MG PO CAPS
ORAL_CAPSULE | ORAL | Status: AC
  Filled 2020-08-06: qty 2

## 2020-08-06 MED ORDER — PROPOFOL 10 MG/ML IV BOLUS
INTRAVENOUS | Status: DC | PRN
  Administered 2020-08-06: 200 mg via INTRAVENOUS

## 2020-08-06 MED ORDER — PROPOFOL 10 MG/ML IV BOLUS
INTRAVENOUS | Status: AC
  Filled 2020-08-06: qty 20

## 2020-08-06 MED ORDER — GABAPENTIN 300 MG PO CAPS
300.0000 mg | ORAL_CAPSULE | ORAL | Status: AC
  Administered 2020-08-06: 300 mg via ORAL

## 2020-08-06 MED ORDER — MEPERIDINE HCL 25 MG/ML IJ SOLN: 6.2500 mg | INTRAMUSCULAR | Status: DC | PRN

## 2020-08-06 MED ORDER — HYDROCODONE-ACETAMINOPHEN 5-325 MG PO TABS
1.0000 | ORAL_TABLET | Freq: Four times a day (QID) | ORAL | 0 refills | Status: DC | PRN
Start: 2020-08-06 — End: 2023-02-18

## 2020-08-06 MED ORDER — HYDROMORPHONE HCL 1 MG/ML IJ SOLN: 0.2500 mg | INTRAMUSCULAR | Status: DC | PRN

## 2020-08-06 MED ORDER — FENTANYL CITRATE (PF) 100 MCG/2ML IJ SOLN
INTRAMUSCULAR | Status: AC
  Filled 2020-08-06: qty 2

## 2020-08-06 MED ORDER — LIDOCAINE HCL (CARDIAC) PF 100 MG/5ML IV SOSY
PREFILLED_SYRINGE | INTRAVENOUS | Status: DC | PRN
  Administered 2020-08-06: 50 mg via INTRAVENOUS

## 2020-08-06 MED ORDER — LIDOCAINE HCL (PF) 1 % IJ SOLN
INTRAMUSCULAR | Status: AC
  Filled 2020-08-06: qty 60

## 2020-08-06 SURGICAL SUPPLY — 31 items
ADH SKN CLS APL DERMABOND .7 (GAUZE/BANDAGES/DRESSINGS) ×1
APL PRP STRL LF DISP 70% ISPRP (MISCELLANEOUS) ×1
BLADE SURG 15 STRL LF DISP TIS (BLADE) ×1 IMPLANT
BLADE SURG 15 STRL SS (BLADE) ×2
CHLORAPREP W/TINT 26 (MISCELLANEOUS) ×2 IMPLANT
COVER BACK TABLE 60X90IN (DRAPES) ×2 IMPLANT
COVER MAYO STAND STRL (DRAPES) ×2 IMPLANT
COVER PROBE W GEL 5X96 (DRAPES) ×2 IMPLANT
DERMABOND ADVANCED (GAUZE/BANDAGES/DRESSINGS) ×1
DERMABOND ADVANCED .7 DNX12 (GAUZE/BANDAGES/DRESSINGS) ×1 IMPLANT
DRAPE LAPAROSCOPIC ABDOMINAL (DRAPES) ×2 IMPLANT
DRAPE UTILITY XL STRL (DRAPES) ×2 IMPLANT
ELECT REM PT RETURN 9FT ADLT (ELECTROSURGICAL) ×2
ELECTRODE REM PT RTRN 9FT ADLT (ELECTROSURGICAL) ×1 IMPLANT
GLOVE SURG SIGNA 7.5 PF LTX (GLOVE) ×2 IMPLANT
GOWN STRL REUS W/ TWL LRG LVL3 (GOWN DISPOSABLE) ×1 IMPLANT
GOWN STRL REUS W/ TWL XL LVL3 (GOWN DISPOSABLE) ×1 IMPLANT
GOWN STRL REUS W/TWL LRG LVL3 (GOWN DISPOSABLE) ×2
GOWN STRL REUS W/TWL XL LVL3 (GOWN DISPOSABLE) ×2
KIT MARKER MARGIN INK (KITS) ×2 IMPLANT
NEEDLE HYPO 25X1 1.5 SAFETY (NEEDLE) ×2 IMPLANT
PACK BASIN DAY SURGERY FS (CUSTOM PROCEDURE TRAY) ×2 IMPLANT
PENCIL SMOKE EVACUATOR (MISCELLANEOUS) ×2 IMPLANT
SLEEVE SCD COMPRESS KNEE MED (MISCELLANEOUS) ×2 IMPLANT
SPONGE LAP 4X18 RFD (DISPOSABLE) ×2 IMPLANT
SUT MNCRL AB 4-0 PS2 18 (SUTURE) ×2 IMPLANT
SUT VIC AB 3-0 SH 27 (SUTURE) ×2
SUT VIC AB 3-0 SH 27X BRD (SUTURE) ×1 IMPLANT
SYR CONTROL 10ML LL (SYRINGE) ×2 IMPLANT
TOWEL GREEN STERILE FF (TOWEL DISPOSABLE) ×2 IMPLANT
TRAY FAXITRON CT DISP (TRAY / TRAY PROCEDURE) ×2 IMPLANT

## 2020-08-06 NOTE — Anesthesia Preprocedure Evaluation (Signed)
Anesthesia Evaluation  Patient identified by MRN, date of birth, ID band Patient awake    Reviewed: Allergy & Precautions, NPO status , Patient's Chart, lab work & pertinent test results  Airway Mallampati: I  TM Distance: >3 FB Neck ROM: Full    Dental   Pulmonary    Pulmonary exam normal        Cardiovascular Normal cardiovascular exam     Neuro/Psych    GI/Hepatic   Endo/Other    Renal/GU      Musculoskeletal   Abdominal   Peds  Hematology   Anesthesia Other Findings   Reproductive/Obstetrics                             Anesthesia Physical Anesthesia Plan  ASA: II  Anesthesia Plan: General   Post-op Pain Management:    Induction: Intravenous  PONV Risk Score and Plan: 3 and Midazolam, Dexamethasone and Ondansetron  Airway Management Planned: LMA  Additional Equipment:   Intra-op Plan:   Post-operative Plan: Extubation in OR  Informed Consent: I have reviewed the patients History and Physical, chart, labs and discussed the procedure including the risks, benefits and alternatives for the proposed anesthesia with the patient or authorized representative who has indicated his/her understanding and acceptance.       Plan Discussed with: CRNA and Surgeon  Anesthesia Plan Comments:         Anesthesia Quick Evaluation  

## 2020-08-06 NOTE — Anesthesia Procedure Notes (Signed)
Procedure Name: LMA Insertion Date/Time: 08/06/2020 11:27 AM Performed by: Cleda Clarks, CRNA Pre-anesthesia Checklist: Patient identified, Emergency Drugs available, Suction available and Patient being monitored Patient Re-evaluated:Patient Re-evaluated prior to induction Oxygen Delivery Method: Circle system utilized Preoxygenation: Pre-oxygenation with 100% oxygen Induction Type: IV induction Ventilation: Mask ventilation without difficulty LMA: LMA inserted LMA Size: 4.0 Number of attempts: 1 Airway Equipment and Method: Bite block Placement Confirmation: positive ETCO2 Tube secured with: Tape Dental Injury: Teeth and Oropharynx as per pre-operative assessment

## 2020-08-06 NOTE — Transfer of Care (Signed)
Immediate Anesthesia Transfer of Care Note  Patient: Alicia [name]  Procedure(s) Performed: RIGHT BREAST LUMPECTOMY WITH RADIOACTIVE SEED LOCALIZATION (Right Breast)  Patient Location: PACU  Anesthesia Type:General  Level of Consciousness: awake, alert  and oriented  Airway & Oxygen Therapy: Patient Spontanous Breathing and Patient connected to nasal cannula oxygen  Post-op Assessment: Report given to RN and Post -op Vital signs reviewed and stable  Post vital signs: Reviewed and stable  Last Vitals:  Vitals Value Taken Time  BP 131/78 08/06/20 1204  Temp    Pulse 74 08/06/20 1206  Resp 14 08/06/20 1206  SpO2 100 % 08/06/20 1206  Vitals shown include unvalidated device data.  Last Pain:  Vitals:   08/06/20 1018  TempSrc: Oral  PainSc: 0-No pain         Complications: No complications documented.

## 2020-08-06 NOTE — Interval H&P Note (Signed)
History and Physical Interval Note:no change in H and p  08/06/2020 11:03 AM  Alicia [name]  has presented today for surgery, with the diagnosis of RIGHT BREAST COMPLEX SCLEROSING LESION.  The various methods of treatment have been discussed with the patient and family. After consideration of risks, benefits and other options for treatment, the patient has consented to  Procedure(s) with comments: RIGHT BREAST LUMPECTOMY WITH RADIOACTIVE SEED LOCALIZATION (Right) - LMA as a surgical intervention.  The patient's history has been reviewed, patient examined, no change in status, stable for surgery.  I have reviewed the patient's chart and labs.  Questions were answered to the patient's satisfaction.     Abigail Miyamoto

## 2020-08-06 NOTE — Discharge Instructions (Signed)
[facility] Office Phone Number [phone]  BREAST BIOPSY/ PARTIAL MASTECTOMY: POST OP INSTRUCTIONS  Always review your discharge instruction sheet given to you by the facility where your surgery was performed.  IF YOU HAVE DISABILITY OR FAMILY LEAVE FORMS, YOU MUST BRING THEM TO THE OFFICE FOR PROCESSING.  DO NOT GIVE THEM TO YOUR DOCTOR.  1. A prescription for pain medication may be given to you upon discharge.  Take your pain medication as prescribed, if needed.  If narcotic pain medicine is not needed, then you may take acetaminophen (Tylenol) or ibuprofen (Advil) as needed. 2. Take your usually prescribed medications unless otherwise directed 3. If you need a refill on your pain medication, please contact your pharmacy.  They will contact our office to request authorization.  Prescriptions will not be filled after 5pm or on week-ends. 4. You should eat very light the first 24 hours after surgery, such as soup, crackers, pudding, etc.  Resume your normal diet the day after surgery. 5. Most patients will experience some swelling and bruising in the breast.  Ice packs and a good support bra will help.  Swelling and bruising can take several days to resolve.  6. It is common to experience some constipation if taking pain medication after surgery.  Increasing fluid intake and taking a stool softener will usually help or prevent this problem from occurring.  A mild laxative (Milk of Magnesia or Miralax) should be taken according to package directions if there are no bowel movements after 48 hours. 7. Unless discharge instructions indicate otherwise, you may remove your bandages 24-48 hours after surgery, and you may shower at that time.  You may have steri-strips (small skin tapes) in place directly over the incision.  These strips should be left on the skin for 7-10 days.  If your surgeon used skin glue on the incision, you may shower in 24 hours.  The glue will flake off over the  next 2-3 weeks.  Any sutures or staples will be removed at the office during your follow-up visit. 8. ACTIVITIES:  You may resume regular daily activities (gradually increasing) beginning the next day.  Wearing a good support bra or sports bra minimizes pain and swelling.  You may have sexual intercourse when it is comfortable. a. You may drive when you no longer are taking prescription pain medication, you can comfortably wear a seatbelt, and you can safely maneuver your car and apply brakes. b. RETURN TO WORK:  ______________________________________________________________________________________ 9. You should see your doctor in the office for a follow-up appointment approximately two weeks after your surgery.  Your doctor's nurse will typically make your follow-up appointment when she calls you with your pathology report.  Expect your pathology report 2-3 business days after your surgery.  You may call to check if you do not hear from [place] after three days. 10. OTHER INSTRUCTIONS:OK TO SHOWER STARTING TOMORROW 11. ICE PACK, TYLENOL, AND IBUPROFEN ALSO FOR PAIN (You had 1000 mg of tylenol at 1030) *No Ibuprofen until 7:30 pm 12. NO VIGOROUS ACTIVITY FOR ONE WEEK _______________________________________________________________________________________________ _____________________________________________________________________________________________________________________________________ _____________________________________________________________________________________________________________________________________ _____________________________________________________________________________________________________________________________________  WHEN TO CALL YOUR DOCTOR: 1. Fever over 101.0 2. Nausea and/or vomiting. 3. Extreme swelling or bruising. 4. Continued bleeding from incision. 5. Increased pain, redness, or drainage from the incision.  The clinic staff is available to answer your  questions during regular business hours.  Please don't hesitate to call and ask to speak to one of the nurses for clinical concerns.  If you have a  medical emergency, go to the nearest emergency room or call 911.  A surgeon from [facility] is always on call at the hospital.  For further questions, please visit [email or web address]      Post Anesthesia Home Care Instructions  Activity: Get plenty of rest for the remainder of the day. A responsible individual must stay with you for 24 hours following the procedure.  For the next 24 hours, DO NOT: -Drive a car -Advertising copywriter -Drink alcoholic beverages -Take any medication unless instructed by your physician -Make any legal decisions or sign important papers.  Meals: Start with liquid foods such as gelatin or soup. Progress to regular foods as tolerated. Avoid greasy, spicy, heavy foods. If nausea and/or vomiting occur, drink only clear liquids until the nausea and/or vomiting subsides. Call your physician if vomiting continues.  Special Instructions/Symptoms: Your throat may feel dry or sore from the anesthesia or the breathing tube placed in your throat during surgery. If this causes discomfort, gargle with warm salt water. The discomfort should disappear within 24 hours.  If you had a scopolamine patch placed behind your ear for the management of post- operative nausea and/or vomiting:  1. The medication in the patch is effective for 72 hours, after which it should be removed.  Wrap patch in a tissue and discard in the trash. Wash hands thoroughly with soap and water. 2. You may remove the patch earlier than 72 hours if you experience unpleasant side effects which may include dry mouth, dizziness or visual disturbances. 3. Avoid touching the patch. Wash your hands with soap and water after contact with the patch.     Call your surgeon if you experience:   1.  Fever over 101.0. 2.  Inability to urinate. 3.   Nausea and/or vomiting. 4.  Extreme swelling or bruising at the surgical site. 5.  Continued bleeding from the incision. 6.  Increased pain, redness or drainage from the incision. 7.  Problems related to your pain medication. 8.  Any problems and/or concerns

## 2020-08-06 NOTE — Anesthesia Postprocedure Evaluation (Signed)
Anesthesia Post Note  Patient: Alicia [name]  Procedure(s) Performed: RIGHT BREAST LUMPECTOMY WITH RADIOACTIVE SEED LOCALIZATION (Right Breast)     Patient location during evaluation: PACU Anesthesia Type: General Level of consciousness: awake and alert Pain management: pain level controlled Vital Signs Assessment: post-procedure vital signs reviewed and stable Respiratory status: spontaneous breathing, nonlabored ventilation, respiratory function stable and patient connected to nasal cannula oxygen Cardiovascular status: blood pressure returned to baseline and stable Postop Assessment: no apparent nausea or vomiting Anesthetic complications: no   No complications documented.  Last Vitals:  Vitals:   08/06/20 1230 08/06/20 1245  BP: 139/69 (!) 145/74  Pulse: 66 62  Resp: 14 16  Temp:  36.6 C  SpO2: 100% 98%    Last Pain:  Vitals:   08/06/20 1245  TempSrc:   PainSc: 0-No pain                 Hal Norrington DAVID

## 2020-08-06 NOTE — Op Note (Signed)
RIGHT BREAST LUMPECTOMY WITH RADIOACTIVE SEED LOCALIZATION  Procedure Note  Alicia [name] 08/06/2020   Pre-op Diagnosis: RIGHT BREAST COMPLEX SCLEROSING LESION     Post-op Diagnosis: same  Procedure(s): RIGHT BREAST LUMPECTOMY WITH RADIOACTIVE SEED LOCALIZATION  Surgeon(s): Abigail Miyamoto, MD  Anesthesia: General  Staff:  Circulator: Maryan Rued, RN Scrub Person: Barrington Ellison, RN  Estimated Blood Loss: Minimal               Specimens: sent to path  Indications: This is a 59 year old female was found to have an abnormality in the right breast on screening mammography.  She underwent a biopsy of this showing a complex sclerosing lesion.  The decision was made to proceed with a radioactive seed guided lumpectomy for complete histologic evaluation of this area.  Procedure: Patient was brought to the operating room and identifies correct patient.  She is placed upon the operating table and anesthesia was induced.  Her right breast was prepped and draped in usual sterile fashion.  I located the radioactive seed with aid of the neoprobe in the upper outer quadrant of the right breast.  I anesthetized skin at the edge of the areola with Marcaine and then made a circumareolar incision with the scalpel.  I then dissected down to the breast tissue electrocautery.  I next started dissecting lateral toward the upper outer quadrant of the breast.  With the aid of the neoprobe was able to locate the radioactive seed deep in the upper outer quadrant of the breast.  I stayed around the radioactive seed with aid of the neoprobe performing a lumpectomy with the cautery.  Once the specimen was removed I marked all margins with paint.  An x-ray was performed on specimen confirming that the radioactive seed and previous tissue marker were in the specimen.  The specimen was then sent to pathology for evaluation.  I achieved hemostasis with cautery.  I anesthetized the incision further with Marcaine.   I then closed the subcutaneous tissue with interrupted 3-0 Vicryl sutures and closed the skin with a running 4-0 Monocryl.  Dermabond was then applied.  The patient tolerated the procedure well.  All the counts were correct at the end of the procedure.  The patient was then extubated in the operating room and taken in a stable condition to the recovery room.          Abigail Miyamoto   Date: 08/06/2020  Time: 11:59 AM

## 2020-08-07 ENCOUNTER — Encounter (HOSPITAL_BASED_OUTPATIENT_CLINIC_OR_DEPARTMENT_OTHER): Payer: Self-pay | Admitting: Surgery

## 2020-08-08 LAB — SURGICAL PATHOLOGY

## 2021-06-30 IMAGING — CT CT RENAL STONE PROTOCOL
2 of 4 series · 16 of 46 positions shown, 18 images · non-contrast
Comparison: None.

CLINICAL DATA: 58-year-old female with flank pain. Concern for
kidney stone.

EXAM:
CT ABDOMEN AND PELVIS WITHOUT CONTRAST
TECHNIQUE: Multidetector CT imaging of the abdomen and pelvis was performed
following the standard protocol without IV contrast.

[Series 3: renal stone 5.0 · axial · 0.85mm/px · z∈[-471,-61]mm · 13 of 90 slices shown, 15 images]
[im 4/90  soft-tissue]
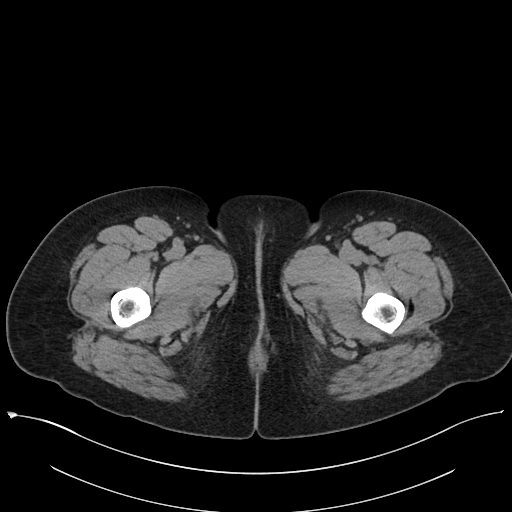
[im 4/90  bone]
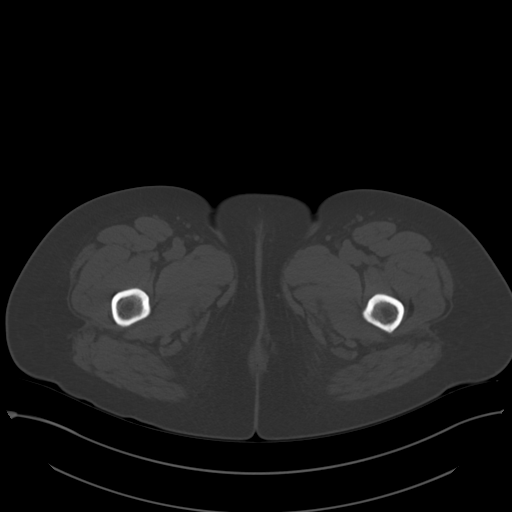
[im 12/90  soft-tissue]
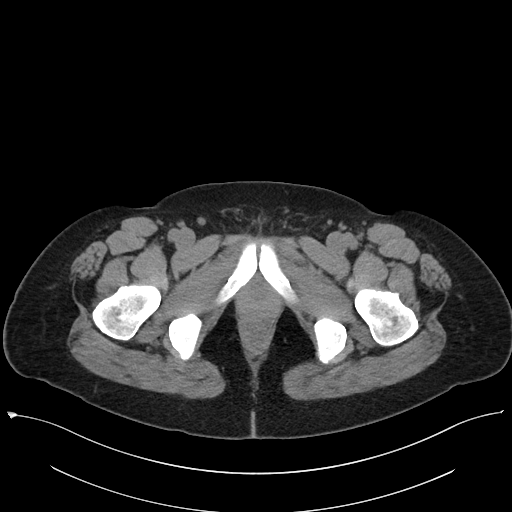
[im 19/90  soft-tissue]
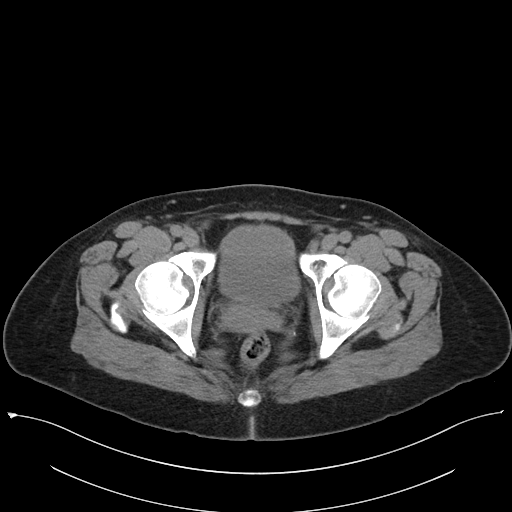
[im 26/90  soft-tissue]
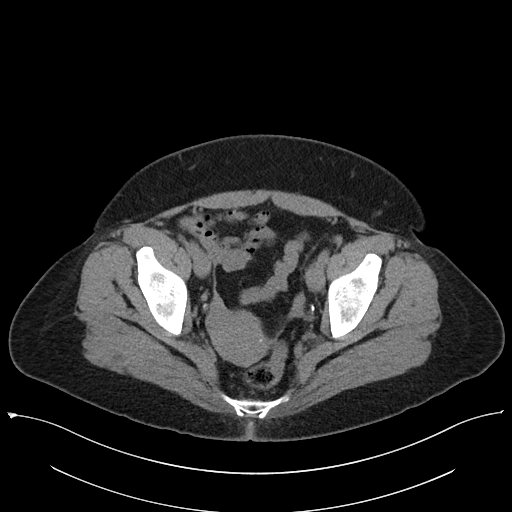
[im 30/90  soft-tissue]
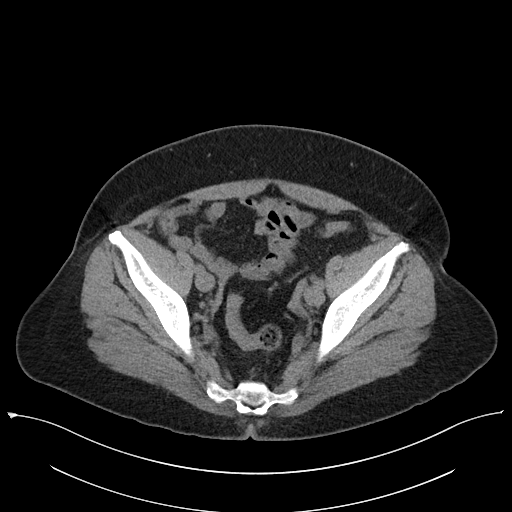
[im 38/90  soft-tissue]
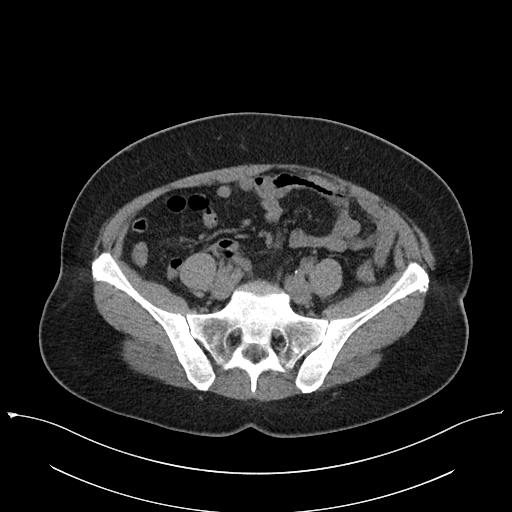
[im 45/90  soft-tissue]
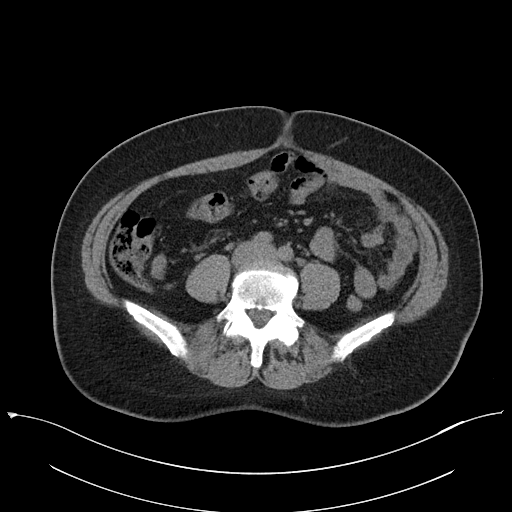
[im 52/90  soft-tissue]
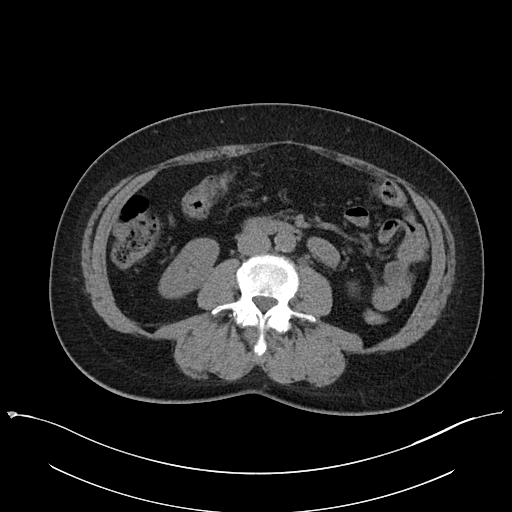
[im 60/90  soft-tissue]
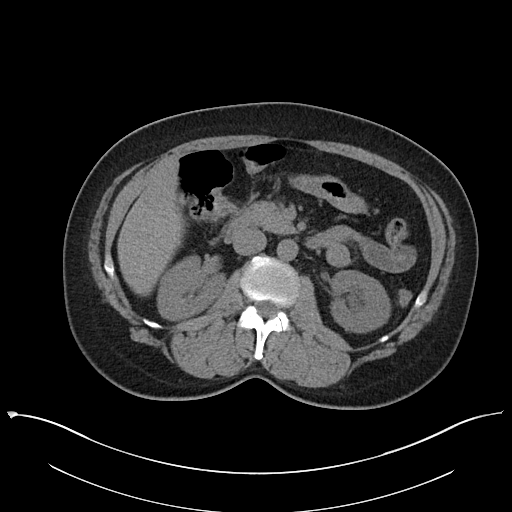
[im 60/90  bone]
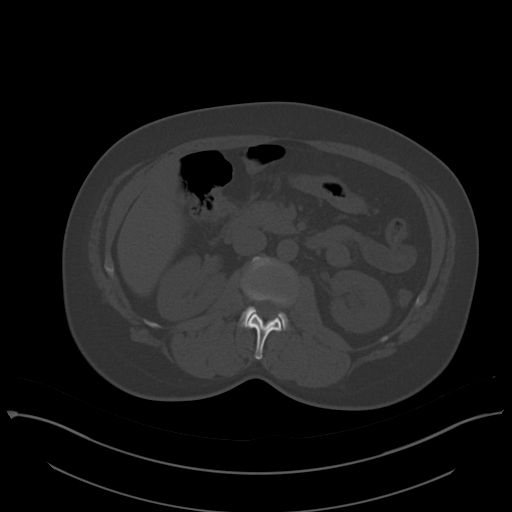
[im 64/90  soft-tissue]
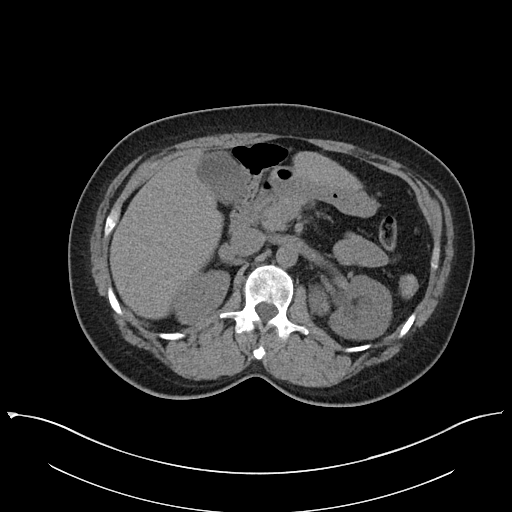
[im 71/90  soft-tissue]
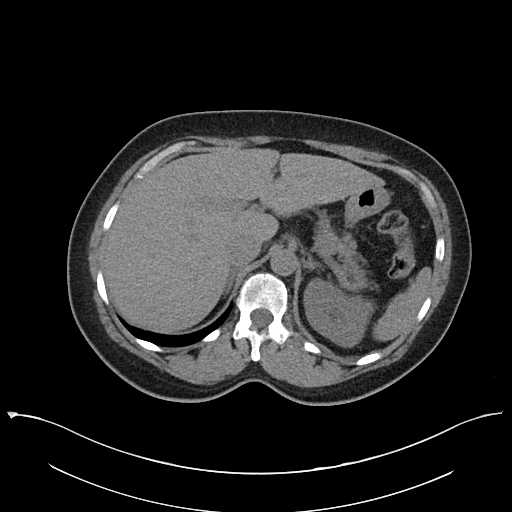
[im 78/90  soft-tissue]
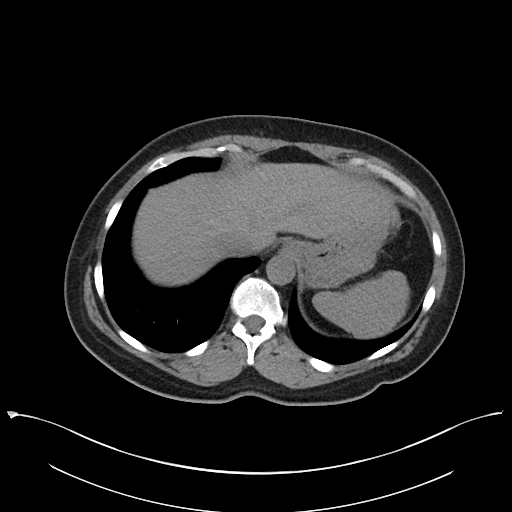
[im 86/90  soft-tissue]
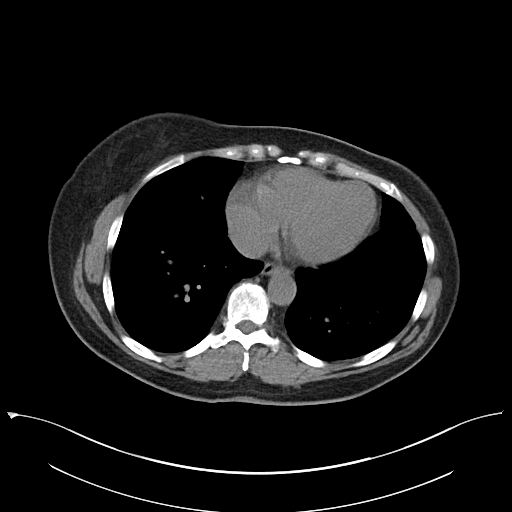

[Series 6: coronal · coronal · 0.76mm/px · 3 of 101 slices shown]
[im 34/101  soft-tissue]
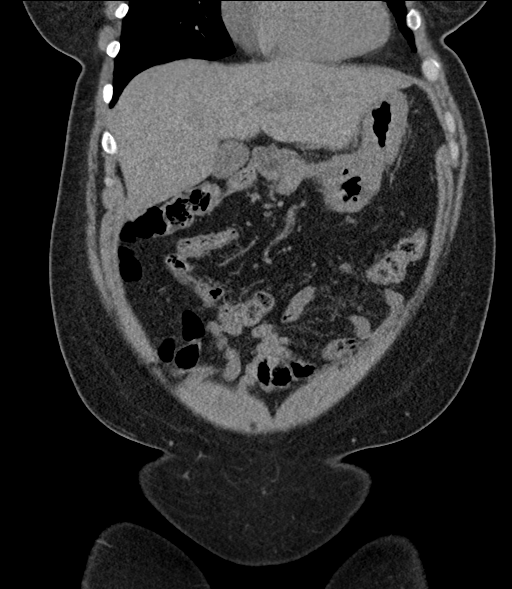
[im 45/101  soft-tissue]
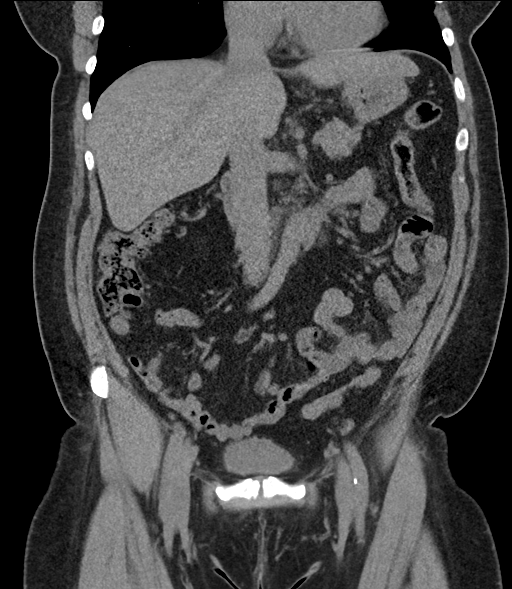
[im 56/101  soft-tissue]
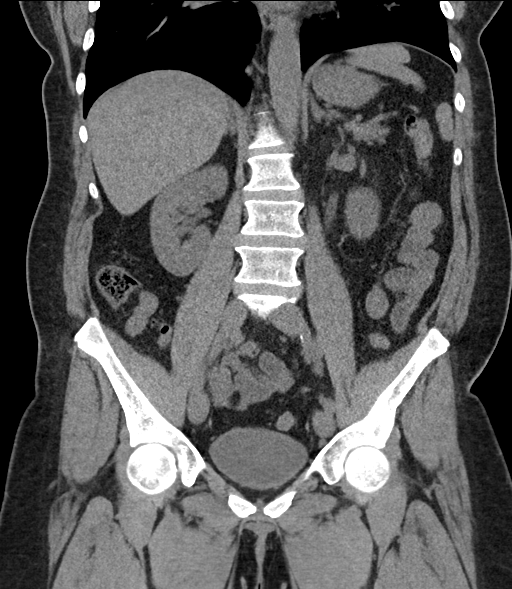

[16 of 46 positions shown; findings below may reference images not displayed]

FINDINGS: Evaluation of this exam is limited in the absence of intravenous
contrast.

Lower chest: There is a 4 mm subpleural nodule at the right lung
base. The visualized lung bases are otherwise clear.

No intra-abdominal free air or free fluid.

Hepatobiliary: No focal liver abnormality is seen. No gallstones,
gallbladder wall thickening, or biliary dilatation.

Pancreas: Unremarkable. No pancreatic ductal dilatation or
surrounding inflammatory changes.

Spleen: Normal in size without focal abnormality.

Adrenals/Urinary Tract: The adrenal glands are unremarkable. There
is a 5 mm stone along the left posterior bladder wall adjacent to
the left ureterovesical junction which may represent a left UVJ
stone or a recently passed left renal calculus. There is mild left
hydronephrosis. There is a 3 mm nonobstructing right renal upper
pole calculus. No hydronephrosis on the right. The visualized right
ureter appears unremarkable.

Stomach/Bowel: There is a 2 cm duodenal diverticulum. There is no
bowel obstruction or active inflammation. The appendix is normal.

Vascular/Lymphatic: Mild aortoiliac atherosclerotic disease. The IVC
is unremarkable. No portal venous gas. There is no adenopathy.

Reproductive: The uterus and ovaries are grossly unremarkable.

Other: None

Musculoskeletal: No acute osseous pathology.
IMPRESSION: 1. A 5 mm left UVJ stone versus a recently passed left renal
calculus with mild left hydronephrosis.
2. A 3 mm nonobstructing right renal upper pole calculus. No
hydronephrosis on the right.
3. No bowel obstruction. Normal appendix.
4. Aortic Atherosclerosis (ITCVJ-70G.G).

## 2021-11-17 ENCOUNTER — Encounter (HOSPITAL_COMMUNITY): Payer: Self-pay

## 2022-05-31 ENCOUNTER — Other Ambulatory Visit: Payer: Self-pay | Admitting: Internal Medicine

## 2022-05-31 DIAGNOSIS — Z9889 Other specified postprocedural states: Secondary | ICD-10-CM

## 2022-06-11 ENCOUNTER — Ambulatory Visit
Admission: RE | Admit: 2022-06-11 | Discharge: 2022-06-11 | Disposition: A | Discharge status: Home / Self Care | Payer: 59 | Source: Ambulatory Visit | Attending: Internal Medicine | Admitting: Internal Medicine

## 2022-06-11 DIAGNOSIS — Z9889 Other specified postprocedural states: Secondary | ICD-10-CM

## 2023-02-18 ENCOUNTER — Other Ambulatory Visit: Payer: Self-pay

## 2023-02-18 ENCOUNTER — Emergency Department (HOSPITAL_COMMUNITY): Payer: 59

## 2023-02-18 ENCOUNTER — Emergency Department (HOSPITAL_COMMUNITY)
Admission: EM | Admit: 2023-02-18 | Discharge: 2023-02-18 | Disposition: A | Discharge status: Home / Self Care | Payer: 59 | Attending: Emergency Medicine | Admitting: Emergency Medicine

## 2023-02-18 ENCOUNTER — Encounter (HOSPITAL_COMMUNITY): Payer: Self-pay

## 2023-02-18 DIAGNOSIS — R109 Unspecified abdominal pain: Secondary | ICD-10-CM | POA: Diagnosis present

## 2023-02-18 DIAGNOSIS — N201 Calculus of ureter: Secondary | ICD-10-CM

## 2023-02-18 LAB — CBC WITH DIFFERENTIAL/PLATELET
Abs Immature Granulocytes: 0.04 10*3/uL (ref 0.00–0.07)
Basophils Absolute: 0.1 10*3/uL (ref 0.0–0.1)
Basophils Relative: 1 %
Eosinophils Absolute: 0.1 10*3/uL (ref 0.0–0.5)
Eosinophils Relative: 1 %
HCT: 42.7 % (ref 36.0–46.0)
Hemoglobin: 13 g/dL (ref 12.0–15.0)
Immature Granulocytes: 0 %
Lymphocytes Relative: 13 %
Lymphs Abs: 1.4 10*3/uL (ref 0.7–4.0)
MCH: 25.5 pg — ABNORMAL LOW (ref 26.0–34.0)
MCHC: 30.4 g/dL (ref 30.0–36.0)
MCV: 83.7 fL (ref 80.0–100.0)
Monocytes Absolute: 0.4 10*3/uL (ref 0.1–1.0)
Monocytes Relative: 4 %
Neutro Abs: 8.3 10*3/uL — ABNORMAL HIGH (ref 1.7–7.7)
Neutrophils Relative %: 81 %
Platelets: 266 10*3/uL (ref 150–400)
RBC: 5.1 MIL/uL (ref 3.87–5.11)
RDW: 14.8 % (ref 11.5–15.5)
WBC: 10.2 10*3/uL (ref 4.0–10.5)
nRBC: 0 % (ref 0.0–0.2)

## 2023-02-18 LAB — COMPREHENSIVE METABOLIC PANEL
ALT: 20 U/L (ref 0–44)
AST: 20 U/L (ref 15–41)
Albumin: 4.4 g/dL (ref 3.5–5.0)
Alkaline Phosphatase: 83 U/L (ref 38–126)
Anion gap: 11 (ref 5–15)
BUN: 18 mg/dL (ref 6–20)
CO2: 21 mmol/L — ABNORMAL LOW (ref 22–32)
Calcium: 9.5 mg/dL (ref 8.9–10.3)
Chloride: 109 mmol/L (ref 98–111)
Creatinine, Ser: 1 mg/dL (ref 0.44–1.00)
GFR, Estimated: >60 mL/min (ref >60)
Glucose, Bld: 107 mg/dL — ABNORMAL HIGH (ref 70–99)
Potassium: 3.5 mmol/L (ref 3.5–5.1)
Sodium: 141 mmol/L (ref 135–145)
Total Bilirubin: 1.1 mg/dL (ref 0.3–1.2)
Total Protein: 7.7 g/dL (ref 6.5–8.1)

## 2023-02-18 LAB — URINALYSIS, ROUTINE W REFLEX MICROSCOPIC
Bacteria, UA: NONE SEEN
Bilirubin Urine: NEGATIVE
Glucose, UA: NEGATIVE mg/dL
Ketones, ur: NEGATIVE mg/dL
Leukocytes,Ua: NEGATIVE
Nitrite: NEGATIVE
Protein, ur: NEGATIVE mg/dL
RBC / HPF: >50 RBC/hpf (ref 0–5)
Specific Gravity, Urine: 1.025 (ref 1.005–1.030)
pH: 5 (ref 5.0–8.0)

## 2023-02-18 MED ORDER — ONDANSETRON 4 MG PO TBDP: 4.0000 mg | ORAL_TABLET | Freq: Three times a day (TID) | ORAL | 0 refills | Status: AC | PRN

## 2023-02-18 MED ORDER — KETOROLAC TROMETHAMINE 10 MG PO TABS: 10.0000 mg | ORAL_TABLET | Freq: Four times a day (QID) | ORAL | 0 refills | Status: AC | PRN

## 2023-02-18 MED ORDER — TAMSULOSIN HCL 0.4 MG PO CAPS
0.4000 mg | ORAL_CAPSULE | Freq: Once | ORAL | Status: AC
  Administered 2023-02-18: 0.4 mg via ORAL
  Filled 2023-02-18: qty 1

## 2023-02-18 MED ORDER — HYDROCODONE-ACETAMINOPHEN 5-325 MG PO TABS
1.0000 | ORAL_TABLET | Freq: Once | ORAL | Status: AC
  Administered 2023-02-18: 1 via ORAL
  Filled 2023-02-18: qty 1

## 2023-02-18 MED ORDER — OXYCODONE HCL 5 MG PO TABS: 5.0000 mg | ORAL_TABLET | Freq: Four times a day (QID) | ORAL | 0 refills | Status: AC | PRN

## 2023-02-18 MED ORDER — KETOROLAC TROMETHAMINE 30 MG/ML IJ SOLN
30.0000 mg | Freq: Once | INTRAMUSCULAR | Status: AC
  Administered 2023-02-18: 30 mg via INTRAMUSCULAR
  Filled 2023-02-18: qty 1

## 2023-02-18 MED ORDER — ONDANSETRON 8 MG PO TBDP
8.0000 mg | ORAL_TABLET | Freq: Once | ORAL | Status: AC
  Administered 2023-02-18: 8 mg via ORAL
  Filled 2023-02-18: qty 1

## 2023-02-18 NOTE — ED Provider Notes (Signed)
[place] EMERGENCY DEPARTMENT AT [facility] Provider Note   CSN: [id number] Arrival date & time: 02/18/23  1704     History  Chief Complaint  Patient presents with   Flank Pain    Alicia [name] is a 62 y.o. female.   Flank Pain   62 year old female presents emergency department complaints of left flank pain.  Patient reports symptoms beginning around 3 PM abruptly.  States she has a history of kidney stones and this feels similarly.  Reports some darker colored urine with some urinary frequency but denies dysuria.  Reports feelings of nausea with 1-2 bouts of emesis since onset.  Denies fever, chills, change in bowel habits, vaginal symptoms.  States she has been able to pass kidney stones without intervention in the past.  Past medical history significant for kidney stone, neuromuscular disorder,  Home Medications Prior to Admission medications   Medication Sig Start Date End Date Taking? Authorizing Provider  ketorolac (TORADOL) 10 MG tablet Take 1 tablet (10 mg total) by mouth every 6 (six) hours as needed for moderate pain or severe pain. 02/18/23  Yes Sherian Maroon A, PA  ondansetron (ZOFRAN-ODT) 4 MG disintegrating tablet Take 1 tablet (4 mg total) by mouth every 8 (eight) hours as needed for nausea or vomiting. 02/18/23  Yes Sherian Maroon A, PA  oxyCODONE (ROXICODONE) 5 MG immediate release tablet Take 1 tablet (5 mg total) by mouth every 6 (six) hours as needed for severe pain or breakthrough pain. 02/18/23  Yes Sherian Maroon A, PA  calcium carbonate (OS-CAL - DOSED IN MG OF ELEMENTAL CALCIUM) 1250 (500 Ca) MG tablet Take 1 tablet by mouth.    [provider]  cholecalciferol (VITAMIN D) 1000 UNITS tablet Take 1,000 Units by mouth daily.    [provider]  famciclovir (FAMVIR) 500 MG tablet Take 500 mg by mouth daily.    [provider]  Multiple Vitamins-Minerals (MULTIVITAMIN WITH MINERALS) tablet Take 1 tablet by mouth daily.  Alive    [provider]  vitamin B-12 (CYANOCOBALAMIN) 1000 MCG tablet Take 1,000 mcg by mouth daily.    [provider]  vitamin C (ASCORBIC ACID) 250 MG tablet Take 500 mg by mouth daily.    [provider]  vitamin E 180 MG (400 UNITS) capsule Take 400 Units by mouth daily.    [provider]  zinc gluconate 50 MG tablet Take 50 mg by mouth daily.    [provider]      Allergies    Patient has no known allergies.    Review of Systems   Review of Systems  Genitourinary:  Positive for flank pain.  All other systems reviewed and are negative.   Physical Exam Updated Vital Signs BP (!) 160/86   Pulse 75   Temp 98 F (36.7 C) (Oral)   Resp 18   Ht 5\' 9"  (1.753 m)   Wt 86.2 kg   LMP 12/28/2012   SpO2 97%   BMI 28.06 kg/m  Physical Exam Vitals and nursing note reviewed.  Constitutional:      General: She is not in acute distress.    Appearance: She is well-developed.  HENT:     Head: Normocephalic and atraumatic.  Eyes:     Conjunctiva/sclera: Conjunctivae normal.  Cardiovascular:     Rate and Rhythm: Normal rate and regular rhythm.     Heart sounds: No murmur heard. Pulmonary:     Effort: Pulmonary effort is normal. No respiratory  distress.     Breath sounds: Normal breath sounds.  Abdominal:     Palpations: Abdomen is soft.     Tenderness: There is left CVA tenderness.     Comments: Mild left lower abdominal tenderness.  Musculoskeletal:        General: No swelling.     Cervical back: Neck supple.     Right lower leg: No edema.     Left lower leg: No edema.  Skin:    General: Skin is warm and dry.     Capillary Refill: Capillary refill takes less than 2 seconds.  Neurological:     Mental Status: She is alert.  Psychiatric:        Mood and Affect: Mood normal.     ED Results / Procedures / Treatments   Labs (all labs ordered are listed, but only abnormal results are displayed) Labs Reviewed  URINALYSIS,  ROUTINE W REFLEX MICROSCOPIC - Abnormal; Notable for the following components:      Result Value   Hgb urine dipstick LARGE (*)    All other components within normal limits  CBC WITH DIFFERENTIAL/PLATELET - Abnormal; Notable for the following components:   MCH 25.5 (*)    Neutro Abs 8.3 (*)    All other components within normal limits  COMPREHENSIVE METABOLIC PANEL - Abnormal; Notable for the following components:   CO2 21 (*)    Glucose, Bld 107 (*)    All other components within normal limits    EKG None  Radiology CT Renal Stone Study  Result Date: 02/18/2023 CLINICAL DATA:  Left flank pain. EXAM: CT ABDOMEN AND PELVIS WITHOUT CONTRAST TECHNIQUE: Multidetector CT imaging of the abdomen and pelvis was performed following the standard protocol without IV contrast. RADIATION DOSE REDUCTION: This exam was performed according to the departmental dose-optimization program which includes automated exposure control, adjustment of the mA and/or kV according to patient size and/or use of iterative reconstruction technique. COMPARISON:  CT without contrast 03/23/2020 FINDINGS: Lower chest: No acute abnormality. Hepatobiliary: Noncontrast liver attenuation is homogeneous. No focal lesion is seen without contrast. The gallbladder and bile ducts are unremarkable. Pancreas: Unremarkable without contrast. Spleen: Unremarkable without contrast. Adrenals/Urinary Tract: There is no adrenal mass or contour deforming abnormality of the unenhanced kidneys. There are occasional punctate caliceal stones in the left kidney and 2 stones in the upper pole on the right measuring 1 mm and 2 mm. On the left there is also renal edema, mild perinephric edema, and mild hydroureteronephrosis due to a 3 mm UVJ stone. The bladder wall is unremarkable for the degree of distention. Stomach/Bowel: No dilatation or wall thickening including the appendix. There is uncomplicated diverticulosis of the transverse and left-sided colon.  Vascular/Lymphatic: Aortic atherosclerosis. No enlarged abdominal or pelvic lymph nodes. Reproductive: Uterus and bilateral adnexa are unremarkable. Other: There is a tiny umbilical fat hernia. There are no incarcerated hernias. There is no free air, free hemorrhage or free fluid. Musculoskeletal: There is lumbar spine facet hypertrophy greatest at L4-5 where there is a mild grade 1 anterolisthesis. No acute or other significant osseous findings. IMPRESSION: 1. 3 mm left UVJ stone with mild obstructive uropathy. 2. Additional nonobstructive micronephrolithiasis. 3. Diverticulosis without evidence of diverticulitis. 4. Aortic atherosclerosis. 5. Tiny umbilical fat hernia. Aortic Atherosclerosis (ICD10-I70.0). Electronically Signed   By: Almira Bar M.D.   On: 02/18/2023 21:01    Procedures Procedures    Medications Ordered in ED Medications  tamsulosin (FLOMAX) capsule 0.4 mg (has no administration in  time range)  ketorolac (TORADOL) 30 MG/ML injection 30 mg (30 mg Intramuscular Given 02/18/23 1841)  ondansetron (ZOFRAN-ODT) disintegrating tablet 8 mg (8 mg Oral Given 02/18/23 1841)  HYDROcodone-acetaminophen (NORCO/VICODIN) 5-325 MG per tablet 1 tablet (1 tablet Oral Given 02/18/23 1841)    ED Course/ Medical Decision Making/ A&P                             Medical Decision Making Amount and/or Complexity of Data Reviewed Labs: ordered. Radiology: ordered.  Risk Prescription drug management.   This patient presents to the ED for concern of flank pain, this involves an extensive number of treatment options, and is a complaint that carries with it a high risk of complications and morbidity.  The differential diagnosis includes herpes zoster, myositis, nephrolithiasis, fracture, dislocation, sprain   Co morbidities that complicate the patient evaluation  See HPI   Additional history obtained:  Additional history obtained from EMR External records from outside source obtained and  reviewed including hospital records   Lab Tests:  I Ordered, and personally interpreted labs.  The pertinent results include: No leukocytosis.  No evidence of anemia.  Platelets within range.  Mild decrease in bicarb of 21 but otherwise, electrolytes within normal limits.  No transaminitis.  No renal dysfunction.  UA significant for greater than 50 RBCs with large hemoglobin but otherwise unremarkable.   Imaging Studies ordered:  I ordered imaging studies including CT renal stone study I independently visualized and interpreted imaging which showed 3 mm left stone at UVJ, additional nonobstructive micro nephrolithiasis, diverticulosis, aortic atherosclerosis.  Tiny umbilical fat-containing hernia I agree with the radiologist interpretation  Cardiac Monitoring: / EKG:  The patient was maintained on a cardiac monitor.  I personally viewed and interpreted the cardiac monitored which showed an underlying rhythm of: Sinus rhythm   Consultations Obtained:  N/a   Problem List / ED Course / Critical interventions / Medication management  Left ureterolithiasis I ordered medication including Flomax, Norco, Toradol, Zofran  Reevaluation of the patient after these medicines showed that the patient improved I have reviewed the patients home medicines and have made adjustments as needed   Social Determinants of Health:  Denies tobacco, illicit drug use   Test / Admission - Considered:  Left ureterolithiasis Vitals signs significant for hypertension with blood pressure 160/86. Otherwise within normal range and stable throughout visit. Laboratory/imaging studies significant for: See above 62 year old female presents emergency department complaints of left-sided flank pain.  Patient with evidence of 3 mm left-sided kidney stone at UVJ.  No evidence of urinary tract infection or AKI.  Patient noted significant improvement in pain with medicines administered on the emergency department.  Will  recommend continued similar medications in the outpatient setting.  Recommend follow-up with urology outpatient for reassessment of the symptoms.  Patient overall well-appearing, afebrile in no acute distress.  Treatment plan discussed at length with patient and she acknowledged understanding was agreeable to said plan.  Further workup deemed unnecessary at this time while in the emergency department. Worrisome signs and symptoms were discussed with the patient, and the patient acknowledged understanding to return to the ED if noticed. Patient was stable upon discharge.          Final Clinical Impression(s) / ED Diagnoses Final diagnoses:  Left flank pain  Ureterolithiasis    Rx / DC Orders ED Discharge Orders          Ordered  ondansetron (ZOFRAN-ODT) 4 MG disintegrating tablet  Every 8 hours PRN        02/18/23 2107    oxyCODONE (ROXICODONE) 5 MG immediate release tablet  Every 6 hours PRN        02/18/23 2107    ketorolac (TORADOL) 10 MG tablet  Every 6 hours PRN        02/18/23 2107              Peter Garter, [place] 02/18/23 2115    Gwyneth Sprout, MD 02/24/23 1630

## 2023-02-18 NOTE — Discharge Instructions (Addendum)
As discussed, you have a 3 mm obstructing kidney stone that looks as if it is about to pass.  Will send in medicine to aid in passage of stone called Flomax to take daily.  Will send in nausea medicine to take as needed as well as Toradol to take for baseline pain with oxycodone to take for breakthrough pain.  Toradol is in the same family as ibuprofen/Aleve so please do not take these medications together.  Recommend follow-up with urology outpatient.  Attached is number to call to set up an appointment.  Please do not hesitate to return to emergency department if the worrisome signs and symptoms we discussed become apparent.

## 2023-02-18 NOTE — ED Triage Notes (Signed)
Patient has had left sided flank pain for 2 hours. No burning urination. History of kidney stones.

## 2023-05-24 ENCOUNTER — Other Ambulatory Visit: Payer: Self-pay | Admitting: Obstetrics and Gynecology

## 2023-05-24 DIAGNOSIS — Z1231 Encounter for screening mammogram for malignant neoplasm of breast: Secondary | ICD-10-CM

## 2023-06-23 ENCOUNTER — Ambulatory Visit: Payer: 59

## 2023-07-14 ENCOUNTER — Ambulatory Visit: Payer: 59

## 2023-07-18 ENCOUNTER — Ambulatory Visit
Admission: RE | Admit: 2023-07-18 | Discharge: 2023-07-18 | Disposition: A | Discharge status: Home / Self Care | Payer: 59 | Source: Ambulatory Visit | Attending: Obstetrics and Gynecology

## 2023-07-18 DIAGNOSIS — Z1231 Encounter for screening mammogram for malignant neoplasm of breast: Secondary | ICD-10-CM

## 2024-06-18 ENCOUNTER — Other Ambulatory Visit: Payer: Self-pay | Admitting: Obstetrics and Gynecology

## 2024-06-18 ENCOUNTER — Encounter: Payer: Self-pay | Admitting: Obstetrics and Gynecology

## 2024-06-18 DIAGNOSIS — Z0000 Encounter for general adult medical examination without abnormal findings: Secondary | ICD-10-CM

## 2024-07-31 ENCOUNTER — Ambulatory Visit
Admission: RE | Admit: 2024-07-31 | Discharge: 2024-07-31 | Disposition: A | Source: Ambulatory Visit | Attending: Obstetrics and Gynecology

## 2024-07-31 DIAGNOSIS — Z0000 Encounter for general adult medical examination without abnormal findings: Secondary | ICD-10-CM
